# Patient Record
Sex: Female | Born: 1965
Health system: Southern US, Community
[De-identification: ages and names within clinical notes are randomized; demographics above are authoritative.]

## PROBLEM LIST (undated history)

## (undated) ENCOUNTER — Emergency Department: Discharge status: Absent without leave | Payer: Medicare Other

## (undated) DIAGNOSIS — I10 Essential (primary) hypertension: Secondary | ICD-10-CM

## (undated) DIAGNOSIS — IMO0002 Reserved for concepts with insufficient information to code with codable children: Secondary | ICD-10-CM

## (undated) DIAGNOSIS — G4730 Sleep apnea, unspecified: Secondary | ICD-10-CM

## (undated) DIAGNOSIS — F329 Major depressive disorder, single episode, unspecified: Secondary | ICD-10-CM

## (undated) DIAGNOSIS — K200 Eosinophilic esophagitis: Secondary | ICD-10-CM

## (undated) DIAGNOSIS — I491 Atrial premature depolarization: Secondary | ICD-10-CM

## (undated) DIAGNOSIS — F32A Depression, unspecified: Secondary | ICD-10-CM

## (undated) DIAGNOSIS — K219 Gastro-esophageal reflux disease without esophagitis: Secondary | ICD-10-CM

## (undated) DIAGNOSIS — E669 Obesity, unspecified: Secondary | ICD-10-CM

## (undated) DIAGNOSIS — R001 Bradycardia, unspecified: Secondary | ICD-10-CM

## (undated) DIAGNOSIS — G43909 Migraine, unspecified, not intractable, without status migrainosus: Secondary | ICD-10-CM

## (undated) DIAGNOSIS — J45909 Unspecified asthma, uncomplicated: Secondary | ICD-10-CM

## (undated) DIAGNOSIS — M797 Fibromyalgia: Secondary | ICD-10-CM

## (undated) HISTORY — PX: GALLBLADDER SURGERY: SHX652

## (undated) HISTORY — PX: COLONOSCOPY: SHX174

## (undated) HISTORY — DX: Major depressive disorder, single episode, unspecified: F32.9

## (undated) HISTORY — PX: ACHILLES TENDON REPAIR: SUR1153

## (undated) HISTORY — DX: Eosinophilic esophagitis: K20.0

## (undated) HISTORY — DX: Obesity, unspecified: E66.9

## (undated) HISTORY — DX: Depression, unspecified: F32.A

## (undated) HISTORY — DX: Migraine, unspecified, not intractable, without status migrainosus: G43.909

## (undated) HISTORY — DX: Gastro-esophageal reflux disease without esophagitis: K21.9

## (undated) HISTORY — DX: Essential (primary) hypertension: I10

---

## 2005-09-09 ENCOUNTER — Ambulatory Visit: Payer: Self-pay | Admitting: Internal Medicine

## 2005-09-18 ENCOUNTER — Ambulatory Visit: Payer: Self-pay | Admitting: Cardiology

## 2005-09-29 ENCOUNTER — Ambulatory Visit: Payer: Self-pay | Admitting: Internal Medicine

## 2006-01-20 ENCOUNTER — Ambulatory Visit: Payer: Self-pay | Admitting: Cardiology

## 2006-02-23 ENCOUNTER — Ambulatory Visit: Payer: Self-pay | Admitting: Cardiology

## 2006-03-09 ENCOUNTER — Ambulatory Visit: Payer: Self-pay | Admitting: Cardiology

## 2006-11-24 ENCOUNTER — Ambulatory Visit: Payer: Self-pay | Admitting: Cardiology

## 2007-02-08 ENCOUNTER — Encounter: Admission: RE | Admit: 2007-02-08 | Discharge: 2007-02-08 | Payer: Self-pay | Admitting: *Deleted

## 2007-09-27 ENCOUNTER — Encounter: Admission: RE | Admit: 2007-09-27 | Discharge: 2007-09-27 | Payer: Self-pay | Admitting: Unknown Physician Specialty

## 2007-10-19 ENCOUNTER — Ambulatory Visit: Payer: Self-pay | Admitting: Cardiology

## 2008-05-25 ENCOUNTER — Emergency Department (HOSPITAL_COMMUNITY): Admission: EM | Admit: 2008-05-25 | Discharge: 2008-05-25 | Payer: Self-pay | Admitting: Emergency Medicine

## 2009-05-17 ENCOUNTER — Encounter: Admission: RE | Admit: 2009-05-17 | Discharge: 2009-05-17 | Payer: Self-pay | Admitting: Neurology

## 2009-10-09 ENCOUNTER — Encounter: Admission: RE | Admit: 2009-10-09 | Discharge: 2009-11-01 | Payer: Self-pay | Admitting: Neurology

## 2010-04-02 ENCOUNTER — Emergency Department (HOSPITAL_COMMUNITY): Admission: EM | Admit: 2010-04-02 | Discharge: 2010-04-02 | Payer: Self-pay | Admitting: Emergency Medicine

## 2010-11-24 ENCOUNTER — Encounter: Payer: Self-pay | Admitting: Unknown Physician Specialty

## 2011-01-20 LAB — COMPREHENSIVE METABOLIC PANEL
Alkaline Phosphatase: 85 U/L (ref 39–117)
Calcium: 9.4 mg/dL (ref 8.4–10.5)
Chloride: 106 mEq/L (ref 96–112)
Creatinine, Ser: 0.8 mg/dL (ref 0.4–1.2)
GFR calc Af Amer: >60 mL/min (ref >60)
Glucose, Bld: 97 mg/dL (ref 70–99)
Sodium: 140 mEq/L (ref 135–145)
Total Bilirubin: 1 mg/dL (ref 0.3–1.2)

## 2011-01-20 LAB — DIFFERENTIAL
Basophils Absolute: 0.1 10*3/uL (ref 0.0–0.1)
Basophils Relative: 1 % (ref 0–1)
Eosinophils Absolute: 0 10*3/uL (ref 0.0–0.7)
Monocytes Absolute: 0.5 10*3/uL (ref 0.1–1.0)
Monocytes Relative: 7 % (ref 3–12)
Neutro Abs: 4.8 10*3/uL (ref 1.7–7.7)
Neutrophils Relative %: 65 % (ref 43–77)

## 2011-01-20 LAB — CBC
Hemoglobin: 13.3 g/dL (ref 12.0–15.0)
RBC: 4.44 MIL/uL (ref 3.87–5.11)
RDW: 13.6 % (ref 11.5–15.5)
WBC: 7.5 10*3/uL (ref 4.0–10.5)

## 2011-01-20 LAB — URINE MICROSCOPIC-ADD ON

## 2011-01-20 LAB — URINALYSIS, ROUTINE W REFLEX MICROSCOPIC
Bilirubin Urine: NEGATIVE
Ketones, ur: NEGATIVE mg/dL
Nitrite: NEGATIVE
Protein, ur: NEGATIVE mg/dL

## 2011-01-20 LAB — LIPASE, BLOOD: Lipase: 26 U/L (ref 11–59)

## 2011-01-20 LAB — URINE CULTURE: Culture: NO GROWTH

## 2011-01-23 ENCOUNTER — Other Ambulatory Visit: Payer: Self-pay | Admitting: Neurology

## 2011-01-23 DIAGNOSIS — R269 Unspecified abnormalities of gait and mobility: Secondary | ICD-10-CM

## 2011-01-23 DIAGNOSIS — R413 Other amnesia: Secondary | ICD-10-CM

## 2011-01-23 DIAGNOSIS — M79609 Pain in unspecified limb: Secondary | ICD-10-CM

## 2011-01-23 DIAGNOSIS — G2581 Restless legs syndrome: Secondary | ICD-10-CM

## 2011-02-03 ENCOUNTER — Ambulatory Visit
Admission: RE | Admit: 2011-02-03 | Discharge: 2011-02-03 | Disposition: A | Discharge status: Home / Self Care | Payer: 59 | Source: Ambulatory Visit | Attending: Neurology | Admitting: Neurology

## 2011-02-03 DIAGNOSIS — M79609 Pain in unspecified limb: Secondary | ICD-10-CM

## 2011-02-03 DIAGNOSIS — G2581 Restless legs syndrome: Secondary | ICD-10-CM

## 2011-02-03 DIAGNOSIS — R269 Unspecified abnormalities of gait and mobility: Secondary | ICD-10-CM

## 2011-02-03 DIAGNOSIS — R413 Other amnesia: Secondary | ICD-10-CM

## 2011-03-21 NOTE — Assessment & Plan Note (Signed)
[facility]                          [facility] NOTE   Alexa, Wright                    MRN:          [id number]  DATE:11/24/2006                            DOB:          12/30/1965    PRIMARY CARDIOLOGIST:  Dr. Willa Rough.   REASON FOR VISIT:  Annual followup.   Since last seen here in the clinic in April of 2007, by Roxanne Mins,  PA-C, the patient reports no interim development of tachy palpitations  or fluttering.   The patient presents with a reported history of SVT dating back to at  least 10 years ago when she was reportedly initially diagnosed with this  at [facility].  We have also, since, seen her for evaluation  of chest pain, felt to be atypical, and for which she had a normal  adequate exercise stress echocardiogram in November 2006, reviewed by  Dr. Willa Rough.   Following her last visit here in the clinic, the patient was placed on a  30-day event monitor, which revealed normal sinus rhythm/sinus  tachycardia with no evidence of any dysrhythmia.   The patient reports today that she is no longer on any medications.  Of  note, she had already been weaned off of the atenolol that Arnette Felts  had been adjusting secondary to complaints of bradycardia.  Of note, the  patient also has a history of asthma and had been briefly hospitalized  with acute bronchitis.   Most recently, the patient has been undergoing evaluation of short-term  memory loss and was felt that it would help to stop the Topamax in this  regard.  Therefore, she is currently not on any medication.   The patient also reports that she has been plagued by a slow heart  rate as well.  She reports numbers in the 50-60 range when seen by her  primary care physician.  However, she reports no associated pre-  syncope/syncope.   The patient continues to try to be compliant by refraining from any  caffeinated beverages.  She does,  however, eat chocolate on occasion.   CURRENT MEDICATIONS:  None.   PHYSICAL EXAMINATION:  Blood pressure 102/76, pulse 60 and regular,  weight 192.8.  GENERAL:  A 45 year old female sitting upright in no distress.  HEENT:  Normocephalic, atraumatic.  NECK:  Palpable bilateral carotid pulses without bruits; no JVD.  LUNGS:  Clear to auscultation in all fields.  HEART:  Regular rate and rhythm (S1, S2).  No murmurs, rubs, or gallops.  ABDOMEN:  Soft and nontender.  Intact bowel sounds.  EXTREMITIES:  Palpable pulses with trace edema.  NEURO:  Flat affect but no focal deficit.   IMPRESSION:  1. History of tachy-palpitations.      a.     Reported history of supraventricular tachycardia.      b.     Intolerant to atenolol secondary to bradycardia.      c.     Intolerant to Cardizem secondary to severe lower extremity       edema.      d.     Normal 30-day  event monitor May 2007.  2. Atypical chest pain.      a.     Normal adequate exercise stress echocardiogram in November       2006.  3. History of anxiety.  4. Asthma.  5. History of migraines.  6. Obesity.      a.     History of anorexia/bulimia.   PLAN:  No further cardiac workup is recommended at this time.  The  patient has been reassured with respect to her most recent event  monitor, which was done last year showing normal sinus rhythm.  In the  absence of any interim development of tachy palpitations, therefore, we  have agreed to have the patient return to the clinic and follow-up with  Dr. Willa Rough on an as needed basis.      Rozell Searing, PA-C  Electronically Signed      Luis Abed, MD, [facility]  Electronically Signed   GS/MedQ  DD: 11/24/2006  DT: 11/24/2006  Job #: [id number]   cc:   Donzetta Sprung

## 2011-08-01 LAB — POCT I-STAT, CHEM 8
Chloride: 105
Glucose, Bld: 100 — ABNORMAL HIGH
Hemoglobin: 14.3
Potassium: 3.7
TCO2: 26

## 2011-08-01 LAB — POCT CARDIAC MARKERS
CKMB, poc: <1 — ABNORMAL LOW
Troponin i, poc: <0.05

## 2012-05-15 ENCOUNTER — Emergency Department (HOSPITAL_COMMUNITY)
Admission: EM | Admit: 2012-05-15 | Discharge: 2012-05-16 | Disposition: A | Discharge status: Home / Self Care | Payer: 59 | Attending: Emergency Medicine | Admitting: Emergency Medicine

## 2012-05-15 ENCOUNTER — Emergency Department (HOSPITAL_COMMUNITY): Payer: 59

## 2012-05-15 ENCOUNTER — Encounter (HOSPITAL_COMMUNITY): Payer: Self-pay | Admitting: Emergency Medicine

## 2012-05-15 DIAGNOSIS — R079 Chest pain, unspecified: Secondary | ICD-10-CM

## 2012-05-15 DIAGNOSIS — M79606 Pain in leg, unspecified: Secondary | ICD-10-CM

## 2012-05-15 DIAGNOSIS — J45909 Unspecified asthma, uncomplicated: Secondary | ICD-10-CM | POA: Insufficient documentation

## 2012-05-15 DIAGNOSIS — Z79899 Other long term (current) drug therapy: Secondary | ICD-10-CM | POA: Insufficient documentation

## 2012-05-15 DIAGNOSIS — IMO0002 Reserved for concepts with insufficient information to code with codable children: Secondary | ICD-10-CM | POA: Insufficient documentation

## 2012-05-15 DIAGNOSIS — R209 Unspecified disturbances of skin sensation: Secondary | ICD-10-CM | POA: Insufficient documentation

## 2012-05-15 DIAGNOSIS — M79609 Pain in unspecified limb: Secondary | ICD-10-CM | POA: Insufficient documentation

## 2012-05-15 DIAGNOSIS — M7989 Other specified soft tissue disorders: Secondary | ICD-10-CM | POA: Insufficient documentation

## 2012-05-15 DIAGNOSIS — R001 Bradycardia, unspecified: Secondary | ICD-10-CM | POA: Insufficient documentation

## 2012-05-15 DIAGNOSIS — M797 Fibromyalgia: Secondary | ICD-10-CM | POA: Insufficient documentation

## 2012-05-15 DIAGNOSIS — R0602 Shortness of breath: Secondary | ICD-10-CM | POA: Insufficient documentation

## 2012-05-15 DIAGNOSIS — I491 Atrial premature depolarization: Secondary | ICD-10-CM | POA: Insufficient documentation

## 2012-05-15 HISTORY — DX: Reserved for concepts with insufficient information to code with codable children: IMO0002

## 2012-05-15 HISTORY — DX: Unspecified asthma, uncomplicated: J45.909

## 2012-05-15 HISTORY — DX: Fibromyalgia: M79.7

## 2012-05-15 HISTORY — DX: Atrial premature depolarization: I49.1

## 2012-05-15 HISTORY — DX: Bradycardia, unspecified: R00.1

## 2012-05-15 LAB — CBC WITH DIFFERENTIAL/PLATELET
Basophils Absolute: 0.1 10*3/uL (ref 0.0–0.1)
Basophils Relative: 1 % (ref 0–1)
Eosinophils Absolute: 0.1 10*3/uL (ref 0.0–0.7)
Eosinophils Relative: 2 % (ref 0–5)
HCT: 40.1 % (ref 36.0–46.0)
Hemoglobin: 13.7 g/dL (ref 12.0–15.0)
Lymphocytes Relative: 28 % (ref 12–46)
Lymphs Abs: 1.9 10*3/uL (ref 0.7–4.0)
MCH: 29.7 pg (ref 26.0–34.0)
MCHC: 34.2 g/dL (ref 30.0–36.0)
MCV: 86.8 fL (ref 78.0–100.0)
Monocytes Absolute: 0.5 10*3/uL (ref 0.1–1.0)
Monocytes Relative: 8 % (ref 3–12)
Neutro Abs: 4.2 10*3/uL (ref 1.7–7.7)
Neutrophils Relative %: 61 % (ref 43–77)
Platelets: 237 10*3/uL (ref 150–400)
RBC: 4.62 MIL/uL (ref 3.87–5.11)
RDW: 13 % (ref 11.5–15.5)
WBC: 6.8 10*3/uL (ref 4.0–10.5)

## 2012-05-15 LAB — COMPREHENSIVE METABOLIC PANEL
ALT: 28 U/L (ref 0–35)
AST: 26 U/L (ref 0–37)
Albumin: 3.6 g/dL (ref 3.5–5.2)
Alkaline Phosphatase: 87 U/L (ref 39–117)
BUN: 6 mg/dL (ref 6–23)
CO2: 28 mEq/L (ref 19–32)
Calcium: 9.1 mg/dL (ref 8.4–10.5)
Chloride: 102 mEq/L (ref 96–112)
Creatinine, Ser: 0.69 mg/dL (ref 0.50–1.10)
GFR calc Af Amer: >90 mL/min (ref >90)
GFR calc non Af Amer: >90 mL/min (ref >90)
Glucose, Bld: 105 mg/dL — ABNORMAL HIGH (ref 70–99)
Potassium: 3.3 mEq/L — ABNORMAL LOW (ref 3.5–5.1)
Sodium: 140 mEq/L (ref 135–145)
Total Bilirubin: 0.3 mg/dL (ref 0.3–1.2)
Total Protein: 7 g/dL (ref 6.0–8.3)

## 2012-05-15 LAB — D-DIMER, QUANTITATIVE (NOT AT ARMC): D-Dimer, Quant: 0.55 ug/mL-FEU — ABNORMAL HIGH (ref 0.00–0.48)

## 2012-05-15 LAB — POCT I-STAT TROPONIN I: Troponin i, poc: 0 ng/mL (ref 0.00–0.08)

## 2012-05-15 MED ORDER — ENOXAPARIN SODIUM 100 MG/ML ~~LOC~~ SOLN
1.0000 mg/kg | Freq: Once | SUBCUTANEOUS | Status: AC
  Administered 2012-05-15: 90 mg via SUBCUTANEOUS
  Filled 2012-05-15: qty 1

## 2012-05-15 MED ORDER — FENTANYL CITRATE 0.05 MG/ML IJ SOLN
INTRAMUSCULAR | Status: AC
  Filled 2012-05-15: qty 2

## 2012-05-15 MED ORDER — MORPHINE SULFATE 4 MG/ML IJ SOLN
6.0000 mg | Freq: Once | INTRAMUSCULAR | Status: AC
  Administered 2012-05-15: 6 mg via INTRAVENOUS
  Filled 2012-05-15: qty 2

## 2012-05-15 MED ORDER — FENTANYL CITRATE 0.05 MG/ML IJ SOLN
100.0000 ug | Freq: Once | INTRAMUSCULAR | Status: AC
  Administered 2012-05-15: 100 ug via INTRAVENOUS
  Filled 2012-05-15 (×2): qty 2

## 2012-05-15 MED ORDER — FENTANYL CITRATE 0.05 MG/ML IJ SOLN
50.0000 ug | Freq: Once | INTRAMUSCULAR | Status: AC
  Administered 2012-05-15: 50 ug via INTRAVENOUS

## 2012-05-15 NOTE — ED Notes (Addendum)
Patient had surgery on her left leg Wednesday (achillies repair); patient reports shortness of breath, tightness around her cast -- called the doctors office and was told to come to the ED for rule out of blood clot.  Sensation and color intact distally to cast.  Patient reports chest tightness.

## 2012-05-15 NOTE — ED Provider Notes (Signed)
History    46yF with L leg pain. Post-op L achilles repair. Says leg feels swollen and numb. This morning had vague upper chest pain which has resolved and also some SOB. NO fever or chills. No n/v. No dizziness or lightheadedness. Denies hx of blood clot.    CSN: [id number]  Arrival date & time 05/15/12  1478   First MD Initiated Contact with Patient 05/15/12 2027      Chief Complaint  Patient presents with  . Leg Pain    (Consider location/radiation/quality/duration/timing/severity/associated sxs/prior treatment) HPI  Past Medical History  Diagnosis Date  . Asthma   . PAC (premature atrial contraction)   . Fibromyalgia   . Bradycardia   . Degenerative disc disease     Past Surgical History  Procedure Date  . Achilles tendon repair     History reviewed. No pertinent family history.  History  Substance Use Topics  . Smoking status: Never Smoker   . Smokeless tobacco: Not on file  . Alcohol Use: No    OB History    Grav Para Term Preterm Abortions TAB SAB Ect Mult Living                  Review of Systems   Review of symptoms negative unless otherwise noted in HPI.  Allergies  Dilaudid and Hydrocodone  Home Medications   Current Outpatient Rx  Name Route Sig Dispense Refill  . ALPRAZOLAM 0.5 MG PO TABS Oral Take 0.5 mg by mouth at bedtime as needed. For sleep or anxiety    . ASPIRIN 325 MG PO TABS Oral Take 325 mg by mouth daily.    . CYCLOBENZAPRINE HCL 5 MG PO TABS Oral Take 5 mg by mouth 3 (three) times daily as needed. For back spasms    . HYDROCODONE-ACETAMINOPHEN 5-325 MG PO TABS Oral Take 1-2 tablets by mouth every 6 (six) hours as needed. For pain    . METHOCARBAMOL 500 MG PO TABS Oral Take 500 mg by mouth every 8 (eight) hours.    . OMEPRAZOLE 20 MG PO CPDR Oral Take 20 mg by mouth daily.    Marland Kitchen OVER THE COUNTER MEDICATION Oral Take 1 tablet by mouth daily. [facility] OTC pill called Itchy Skin      BP 118/83  Pulse 85  Temp 98.5 F (36.9 C)  (Oral)  Resp 12  SpO2 98%  LMP 05/04/2012  Physical Exam  Nursing note and vitals reviewed. Constitutional: She appears well-developed and well-nourished. No distress.  HENT:  Head: Normocephalic and atraumatic.  Eyes: Conjunctivae are normal. Right eye exhibits no discharge. Left eye exhibits no discharge.  Neck: Neck supple.  Cardiovascular: Normal rate, regular rhythm and normal heart sounds.  Exam reveals no gallop and no friction rub.   No murmur heard. Pulmonary/Chest: Effort normal and breath sounds normal. No respiratory distress.  Abdominal: Soft. She exhibits no distension. There is no tenderness.  Musculoskeletal: She exhibits no edema and no tenderness.       Left lower extremity in short-leg cast. Skin is warm. Sensation is intact to light touch. Cap refill is less than 2 seconds. Easily moving all toes. No edema proximal to splint noted.  Neurological: She is alert.  Skin: Skin is warm and dry.  Psychiatric: She has a normal mood and affect. Her behavior is normal. Thought content normal.    ED Course  Procedures (including critical care time)  Labs Reviewed  COMPREHENSIVE METABOLIC PANEL - Abnormal; Notable for the following:  Potassium 3.3 (*)     Glucose, Bld 105 (*)     All other components within normal limits  D-DIMER, QUANTITATIVE - Abnormal; Notable for the following:    D-Dimer, Quant 0.55 (*)     All other components within normal limits  CBC WITH DIFFERENTIAL  POCT I-STAT TROPONIN I   Dg Chest 2 View  05/15/2012  *RADIOLOGY REPORT*  Clinical Data: Shortness of breath, chest pain.  CHEST - 2 VIEW  Comparison: 05/25/2008  Findings: Mild increased AP diameter. Mild interstitial prominence may be accentuated by hypoaeration and patient body habitus.  No focal consolidation, pleural effusion, or pneumothorax.  Heart size upper normal limits.  Mild aortic tortuosity.  Multilevel degenerative changes.  No acute osseous finding.  IMPRESSION: No focal  consolidation.  Original Report Authenticated By: Waneta Martins, M.D.   EKG:  Rhythm: normal sinus Rate: 86 Axis: normal Intervals: normal ST segments: NS ST changes     1. Leg pain   2. Chest pain       MDM  46 year old female with left leg pain and swelling. Suspect that this is related to postop changes. Splint re-wrapped for comfort. She is neurovascularly intact. Patient is at increased risk for blood clot. Will dose empirically Lovenox. Arrange for ultrasound tomorrow morning. No respiratory distress and no respiratory complaints. O2 sats 98-100% on RA. No tachycardia. RR in low teens. Only minimal elevation in d-dimer which not unexpected given recent procedure.      Raeford Razor, MD 05/15/12 2325

## 2012-05-16 ENCOUNTER — Ambulatory Visit (HOSPITAL_COMMUNITY)
Admission: RE | Admit: 2012-05-16 | Discharge: 2012-05-16 | Disposition: A | Discharge status: Home / Self Care | Payer: 59 | Source: Ambulatory Visit | Attending: Emergency Medicine | Admitting: Emergency Medicine

## 2012-05-16 DIAGNOSIS — M79609 Pain in unspecified limb: Secondary | ICD-10-CM | POA: Insufficient documentation

## 2012-05-16 DIAGNOSIS — M79606 Pain in leg, unspecified: Secondary | ICD-10-CM

## 2012-05-16 DIAGNOSIS — M7989 Other specified soft tissue disorders: Secondary | ICD-10-CM

## 2013-03-04 ENCOUNTER — Ambulatory Visit (INDEPENDENT_AMBULATORY_CARE_PROVIDER_SITE_OTHER): Payer: Medicare Other | Admitting: Neurology

## 2013-03-04 ENCOUNTER — Encounter: Payer: Self-pay | Admitting: Neurology

## 2013-03-04 VITALS — BP 136/90 | HR 84 | Ht 66.0 in | Wt 192.0 lb

## 2013-03-04 DIAGNOSIS — G43009 Migraine without aura, not intractable, without status migrainosus: Secondary | ICD-10-CM

## 2013-03-04 DIAGNOSIS — R42 Dizziness and giddiness: Secondary | ICD-10-CM

## 2013-03-04 NOTE — Progress Notes (Signed)
Reason for visit: Vertigo  Alexa Wright is a 47 y.o. female  History of present illness:  Ms. Cannaday is a 47 year old right-handed white female with a history of migraine headaches since around age 5. The patient indicates that her headaches are associated with the menstrual cycle, generally occurring once a month. The patient has classic migraine, and she will get visual phenomena associated fortification spectra involving the right visual field, and it then gradually expands in the visual field over 30 minutes, and then goes away. Thirty minutes after that, the headache will ensue, usually in the frontal areas associated with a throbbing pain, and visual blurring. The patient indicates that the headache usually lasts for one day and then goes away. The patient will take Tylenol for the headache, and if she gets the medication in early it is helpful. In the past, she has been on Topamax with some benefit. The patient had an episode 3 weeks ago associated with vertigo, and she had a sensation that her eyes were crossing. The vertigo event subsided and within 2 hours, the patient had a headache that ensued and lasted for about 5 days. The patient has had resolution of the headache and the dizziness at this point. The patient indicates that with her usual headaches, she will get some numbness and tingly sensations in the hands and feet. The patient has undergone MRI evaluation the brain that shows punctate white matter lesions typical for individuals with migraine. No other significant abnormalities were seen. The patient is sent to this office for further evaluation. The patient indicates that she has photophobia and some mild phonophobia with headache. The patient indicates that stress and menstrual cycle will activate her headache.  Past Medical History  Diagnosis Date  . Asthma   . PAC (premature atrial contraction)   . Fibromyalgia   . Bradycardia   . Degenerative disc disease   .  Migraine   . Depression   . Obesity   . Gastroesophageal reflux disease     Past Surgical History  Procedure Laterality Date  . Achilles tendon repair    . Gallbladder surgery      Family History  Problem Relation Age of Onset  . Cancer Mother     Esphogeal cancer  . Heart attack Father   . Mental retardation Sister   . Migraines Sister   . Migraines Sister     Social history:  reports that she has quit smoking. She does not have any smokeless tobacco history on file. She reports that she does not drink alcohol or use illicit drugs.  Medications:  Current Outpatient Prescriptions on File Prior to Visit  Medication Sig Dispense Refill  . ALPRAZolam (XANAX) 0.5 MG tablet Take 0.5 mg by mouth at bedtime as needed. For sleep or anxiety      . cyclobenzaprine (FLEXERIL) 5 MG tablet Take 5 mg by mouth 3 (three) times daily as needed. For back spasms      . omeprazole (PRILOSEC) 20 MG capsule Take 20 mg by mouth daily.       No current facility-administered medications on file prior to visit.    Allergies:  Allergies  Allergen Reactions  . Dilaudid (Hydromorphone Hcl)   . Hydrocodone   . Morphine And Related     Decreased respirations  . Latex Rash    ROS:  Out of a complete 14 system review of symptoms, the patient complains only of the following symptoms, and all other reviewed systems are negative.  Fatigue Chest pain, palpitations, swelling in the legs Ringing in the ears Moles Blurred vision, loss of vision Diarrhea Easy bruising Feeling cold Joint pain, muscle cramps, achy muscles Memory loss, confusion, headache, weakness Difficulty swallowing, dizziness, tremor Difficulty sleeping, change in appetite, disinterest in activities  Blood pressure 136/90, pulse 84, height 5\' 6"  (1.676 m), weight 192 lb (87.091 kg).  Physical Exam  General: The patient is alert and cooperative at the time of the examination. The patient is moderately obese.  Head: Pupils  are equal, round, and reactive to light. Discs are flat bilaterally.  Neck: The neck is supple, no carotid bruits are noted.  Respiratory: The respiratory examination is clear.  Cardiovascular: The cardiovascular examination reveals a regular rate and rhythm, no obvious murmurs or rubs are noted.  Neuromuscular: The patient has good range of movement of the cervical spine. No crepitus is noted in the temporomandibular joints.  Skin: Extremities are without significant edema.  Neurologic Exam  Mental status:  Cranial nerves: Facial symmetry is present. There is good sensation of the face to pinprick and soft touch bilaterally. The strength of the facial muscles and the muscles to head turning and shoulder shrug are normal bilaterally. Speech is well enunciated, no aphasia or dysarthria is noted. Extraocular movements are full. Visual fields are full.  Motor: The motor testing reveals 5 over 5 strength of all 4 extremities. Good symmetric motor tone is noted throughout.  Sensory: Sensory testing is intact to pinprick, soft touch, vibration sensation, and position sense on all 4 extremities, with the exception that there is some decrease in pinprick sensation on the left hand and left foot. Vibration sensation is decreased on the left foot. No evidence of extinction is noted.  Coordination: Cerebellar testing reveals good finger-nose-finger and heel-to-shin bilaterally.  Gait and station: Gait is normal. Tandem gait is normal. Romberg is negative. No drift is seen.  Reflexes: Deep tendon reflexes are symmetric, but are depressed bilaterally. Toes are downgoing bilaterally.   Assessment/Plan:  1. Classic migraine  2. Fibromyalgia  The patient has a long-standing history of migraine headache associated with visual aura. The patient had a recent migraine associated with vertigo as an aura. The vertigo may return in the future associated with headache. The patient was given samples of  Migranal to take with the onset of the aura with the migraine. If this is effective, this can be used as a treatment for her headache. The headaches are occurring on average once a month, and there is no indication at this time for daily prophylactic medications. The patient will followup through this office if needed. No further neurologic workup is indicated.  Marlan Palau MD 03/04/2013 11:54 AM  [facility] [address] [place], [place] [address]  Phone [phone] Fax [phone]

## 2015-01-31 ENCOUNTER — Encounter: Payer: Self-pay | Admitting: Cardiovascular Disease

## 2015-01-31 ENCOUNTER — Encounter: Payer: Self-pay | Admitting: *Deleted

## 2015-01-31 ENCOUNTER — Ambulatory Visit (INDEPENDENT_AMBULATORY_CARE_PROVIDER_SITE_OTHER): Payer: Medicare Other | Admitting: Cardiovascular Disease

## 2015-01-31 VITALS — BP 118/80 | HR 83 | Ht 65.0 in | Wt 214.0 lb

## 2015-01-31 DIAGNOSIS — I491 Atrial premature depolarization: Secondary | ICD-10-CM | POA: Diagnosis not present

## 2015-01-31 DIAGNOSIS — I471 Supraventricular tachycardia, unspecified: Secondary | ICD-10-CM

## 2015-01-31 DIAGNOSIS — R079 Chest pain, unspecified: Secondary | ICD-10-CM

## 2015-01-31 DIAGNOSIS — I868 Varicose veins of other specified sites: Secondary | ICD-10-CM | POA: Diagnosis not present

## 2015-01-31 DIAGNOSIS — I8390 Asymptomatic varicose veins of unspecified lower extremity: Secondary | ICD-10-CM

## 2015-01-31 DIAGNOSIS — R030 Elevated blood-pressure reading, without diagnosis of hypertension: Secondary | ICD-10-CM

## 2015-01-31 DIAGNOSIS — IMO0001 Reserved for inherently not codable concepts without codable children: Secondary | ICD-10-CM

## 2015-01-31 NOTE — Progress Notes (Signed)
Patient ID: Alexa FieldSharon H Zemanek, female   DOB: 03/07/1966, 49 y.o.   MRN: [id number]       CARDIOLOGY CONSULT NOTE  Patient ID: Alexa Wright MRN: [id number] DOB/AGE: 48/11/1965 49 y.o.  Admit date: (Not on file) Primary Physician Donzetta SprungANIEL, TERRY, MD  Reason for Consultation:   HPI: The patient is a 49 year old woman with a history of SVT, migraines, fibromyalgia, varicose veins, and a history of chest pain. She reportedly had previously undergone both echocardiography and nuclear stress testing in January 2015. These results are currently unavailable to me. These were reportedly unremarkable as per an office note in January 2015 by Dr. Hanley Haysosario. She also reportedly underwent carotid Dopplers, lower extremity venous Dopplers, and ABIs. Ultimately, it was deemed her chest pain was noncardiac in etiology at that time and more likely due to peptic ulcer disease and a hiatal hernia.  She tells me she has a history of SVT and had been on calcium channel blockers and beta blockers. She was reportedly previously seen by Dr. Gala RomneyBensimhon, Dr. Elise Benneuran, and Dr. Myrtis SerKatz. None of these medical records are currently unavailable to me. Her father, Mr. Ramon Dredgedward "Desiree HaneLeroy Harris", is also a patient of mine. She said that she's been having problems with blood pressure and it was elevated yesterday, 143/92, when checked [facility]. She has a history of heel spurs and left Achilles injuries due to this.  She was previously told she had costochondritis.  She's been having dysphagia and is scheduled for an EGD with Dr. Teena DunkBenson on April 11. She's been also experiencing dizzy spells and problems with balance and is scheduled to have a brain MRI next Friday.   She has put on 40 pounds in the last year due to physical activity from her Achilles injury.  ECG performed in the office today demonstrates normal sinus rhythm with no ischemic ST segment or T-wave abnormalities, heart rate 84 bpm.  Fam:  Father has HTN. Her mother had her thyroid removed and has a history of esophageal cancer.     Allergies  Allergen Reactions  . Dilaudid [Hydromorphone Hcl]   . Hydrocodone   . Morphine And Related     Decreased respirations  . Latex Rash    Current Outpatient Prescriptions  Medication Sig Dispense Refill  . albuterol (PROVENTIL HFA;VENTOLIN HFA) 108 (90 BASE) MCG/ACT inhaler Inhale 2 puffs into the lungs every 6 (six) hours as needed for wheezing.    Marland Kitchen. ALPRAZolam (XANAX) 0.5 MG tablet Take 0.5 mg by mouth at bedtime as needed. For sleep or anxiety    . MECLIZINE HCL PO Take by mouth as needed.    . Methocarbamol (ROBAXIN PO) Take by mouth at bedtime.    Marland Kitchen. omeprazole (PRILOSEC) 20 MG capsule Take 20 mg by mouth daily.    . sertraline (ZOLOFT) 100 MG tablet Take 100 mg by mouth daily.     No current facility-administered medications for this visit.    Past Medical History  Diagnosis Date  . Asthma   . PAC (premature atrial contraction)   . Fibromyalgia   . Bradycardia   . Degenerative disc disease   . Migraine   . Depression   . Obesity   . Gastroesophageal reflux disease     Past Surgical History  Procedure Laterality Date  . Achilles tendon repair    . Gallbladder surgery      History   Social History  . Marital Status: Married    Spouse Name:  N/A  . Number of Children: N/A  . Years of Education: N/A   Occupational History  . Not on file.   Social History Main Topics  . Smoking status: Former Smoker    Quit date: 01/31/1992  . Smokeless tobacco: Never Used  . Alcohol Use: No  . Drug Use: No  . Sexual Activity: Not on file   Other Topics Concern  . Not on file   Social History Narrative       Prior to Admission medications   Medication Sig Start Date End Date Taking? Authorizing Provider  albuterol (PROVENTIL HFA;VENTOLIN HFA) 108 (90 BASE) MCG/ACT inhaler Inhale 2 puffs into the lungs every 6 (six) hours as needed for wheezing.    Historical  Provider, MD  ALPRAZolam Prudy Feeler) 0.5 MG tablet Take 0.5 mg by mouth at bedtime as needed. For sleep or anxiety    Historical Provider, MD  cyclobenzaprine (FLEXERIL) 5 MG tablet Take 5 mg by mouth 3 (three) times daily as needed. For back spasms    Historical Provider, MD  omeprazole (PRILOSEC) 20 MG capsule Take 20 mg by mouth daily.    Historical Provider, MD     Review of systems complete and found to be negative unless listed above in HPI     Physical exam Blood pressure 118/80, pulse 83, height  (1.651 m), weight 214 lb (97.07 kg), SpO2 98 %. General: NAD Neck: No JVD, no thyromegaly or thyroid nodule.  Lungs: Clear to auscultation bilaterally with normal respiratory effort. CV: Nondisplaced PMI. Regular rate and rhythm, normal S1/S2, no S3/S4, no murmur.  Trivial periankle edema with bilateral venous varicosities.  No carotid bruit.  Normal pedal pulses.  Abdomen: Soft, nontender, obese, no distention.  Skin: Intact without lesions or rashes.  Neurologic: Alert and oriented x 3.  Psych: Normal affect. Extremities: No clubbing or cyanosis.  HEENT: Normal.   ECG: Most recent ECG reviewed.  Labs:   Lab Results  Component Value Date   WBC 6.8 05/15/2012   HGB 13.7 05/15/2012   HCT 40.1 05/15/2012   MCV 86.8 05/15/2012   PLT 237 05/15/2012   No results for input(s): NA, K, CL, CO2, BUN, CREATININE, CALCIUM, PROT, BILITOT, ALKPHOS, ALT, AST, GLUCOSE in the last 168 hours.  Invalid input(s): LABALBU No results found for: CKTOTAL, CKMB, CKMBINDEX, TROPONINI No results found for: CHOL No results found for: HDL No results found for: LDLCALC No results found for: TRIG No results found for: CHOLHDL No results found for: LDLDIRECT       Studies: No results found.  ASSESSMENT AND PLAN:  1. Elevated BP: I have instructed her to purchase a blood pressure cuff and to check it at 3-4 times per week for the next 4 weeks, but to call me back in 2 weeks to inform me of  these values. I instructed her on the proper method of checking blood pressure. I will also check blood test to assess her renal function, liver function, and thyroid function and I will then determine the most appropriate antihypertensive therapy.  2. Varicose veins: I recommend conservative measures such as weight loss via physical activity in the pool, as well as compression stockings which she already has but is not wearing at present.  3. SVT: No recurrences. No indication for therapy.  4. Cardiovascular testing: I will obtain the results of prior studies.  5. Chest pain: Possibly due to peptic ulcer disease given dysphagia for solids. Soon to have EGD.   Dispo:  f/u 4-6 weeks.  Signed: Prentice Docker, M.D., F.A.C.C.  01/31/2015, 10:32 AM

## 2015-01-31 NOTE — Patient Instructions (Signed)
Your physician recommends that you schedule a follow-up appointment in: 4-6 weeks. Your physician recommends that you continue on your current medications as directed. Please refer to the Current Medication list given to you today. Your physician recommends that you have lab work to check your CMET, TSH, Free T3 & T4. Your physician has requested that you monitor and record your blood pressure readings at home every other day to average about 4 times per week over the next month. Please use the same machine at the same time of day to check your readings and record them. Please send or call the results of your blood pressure readings after 2 weeks so that your doctor can review the results.

## 2015-02-02 ENCOUNTER — Encounter: Payer: Self-pay | Admitting: *Deleted

## 2015-02-05 ENCOUNTER — Telehealth: Payer: Self-pay | Admitting: Cardiovascular Disease

## 2015-02-05 NOTE — Telephone Encounter (Signed)
Please call patient in reference to letter that was mailed to her °

## 2015-02-06 MED ORDER — LISINOPRIL 5 MG PO TABS: 5.0000 mg | ORAL_TABLET | Freq: Every day | ORAL | Status: DC

## 2015-02-06 NOTE — Telephone Encounter (Signed)
Notified via voice mail - [facility] [place] [place]

## 2015-02-06 NOTE — Telephone Encounter (Signed)
Patient questioned if her liver function was done.  Informed patient that CMET & thyroid labs were done and liver funciton was stable.   Stated she did have some concerns over BP - see readings below.    BP this morning 133/94   81  - after getting out of bed.   02/05/15 - 2:00  134/99  75  02/04/15 - 9:00 118/93  84  02/03/15 - 9:30 pm - 149/103  92 - did have headache during this reading.    02/02/15 - 133/93  81

## 2015-02-06 NOTE — Telephone Encounter (Signed)
Start lisinopril 5 mg daily

## 2015-03-06 ENCOUNTER — Encounter: Payer: Self-pay | Admitting: Neurology

## 2015-03-06 ENCOUNTER — Ambulatory Visit (INDEPENDENT_AMBULATORY_CARE_PROVIDER_SITE_OTHER): Payer: Medicare Other | Admitting: Neurology

## 2015-03-06 VITALS — BP 110/82 | HR 80 | Ht 65.0 in | Wt 216.0 lb

## 2015-03-06 DIAGNOSIS — H8111 Benign paroxysmal vertigo, right ear: Secondary | ICD-10-CM | POA: Diagnosis not present

## 2015-03-06 DIAGNOSIS — R269 Unspecified abnormalities of gait and mobility: Secondary | ICD-10-CM

## 2015-03-06 LAB — VITAMIN B12: Vitamin B-12: 449 pg/mL (ref 211–911)

## 2015-03-06 NOTE — Progress Notes (Signed)
[facility] Neurology Division Clinic Note - Initial Visit   Date: 03/06/2015   Alexa Wright MRN: [id number] DOB: 07-22-1966   Dear Dr. Darrelyn Hillock:  Thank you for your kind referral of Alexa Wright for consultation of gait imbalance. Although her history is well known to you, please allow [place] to reiterate it for the purpose of our medical record. The patient was accompanied to the clinic by husband who also provides collateral information.     History of Present Illness: Alexa Wright is a 49 y.o. right-handed Caucasian female with GERD, asthma, fibromyalgia, and depression presenting for evaluation of gait imbalance.  Starting around 2010, she started developing spells of spinning sensation, as if she can pass out.  Spells occur daily and she reports to having associated falls sometimes.  She is more unsteady with climbing up stairs.  She has fallen once this year and several times last year.  She reports having a concussion from a fall in 2012 for which she went to the hospital.  She apparently also had a dislocated knee at the same time.  There is worsening of symptoms with abrupt head movements, especially when looking upwards and to the side.  She walks independently and has not done any physical therapy.  Her father's side of the family has Charcot Marie Tooth syndrome.  She feels nauseous all the time.  She has not seen ENT.  She reptured her left Achilles tendon in 2012 when trying to job and has underwent several repairs.  She continues to have left foot pain and discomfort which compromises her walking.    No numbness or tingling.  She underwent MRI brain in April for these symptoms which showed small dural based tumor, concerning for meningioma. She saw Dr. Conchita Paris who ordered MRI brain wwo contrast which returned normal.      Out-side paper records, electronic medical record, and images have been reviewed where available and summarized as:  MRI brain  02/11/2013:  No acute finding.  Normal study with exception of a few punctate foci of signal without the left temporal and bifrontal white matter regions.  MRI brain wo contrast 02/02/2015:   1.  Small dural-based mass isointense to brain along the tentorium within the right ambien cistern anterolateral to the cerebral peduncle, caudal to the uncus, may represent meningioma.  Thin section T2 weight axial, coronal, and saggital sequences from the cavernous sinus through the brainstem may be of benefit.  2.  Small white matter FLAIR intensities wihtin the supratentorial brain, nonspecific.  Past Medical History  Diagnosis Date  . Asthma   . PAC (premature atrial contraction)   . Fibromyalgia   . Bradycardia   . Degenerative disc disease   . Migraine   . Depression   . Obesity   . Gastroesophageal reflux disease   . Hypertension     Past Surgical History  Procedure Laterality Date  . Achilles tendon repair Left   . Gallbladder surgery       Medications:  Current Outpatient Prescriptions on File Prior to Visit  Medication Sig Dispense Refill  . albuterol (PROVENTIL HFA;VENTOLIN HFA) 108 (90 BASE) MCG/ACT inhaler Inhale 2 puffs into the lungs every 6 (six) hours as needed for wheezing.    Marland Kitchen ALPRAZolam (XANAX) 0.5 MG tablet Take 0.5 mg by mouth at bedtime as needed. For sleep or anxiety    . omeprazole (PRILOSEC) 20 MG capsule Take 20 mg by mouth daily.    . sertraline (ZOLOFT) 100 MG  tablet Take 100 mg by mouth daily.     No current facility-administered medications on file prior to visit.    Allergies:  Allergies  Allergen Reactions  . Dilaudid [Hydromorphone Hcl]   . Hydrocodone   . Morphine And Related     Decreased respirations  . Latex Rash    Family History: Family History  Problem Relation Age of Onset  . Esophageal cancer Mother     Living  . Heart attack Father     Living  . Mental retardation Sister   . Migraines Sister   . Migraines Sister   . Migraines  Daughter     Social History: History   Social History  . Marital Status: Married    Spouse Name: N/A  . Number of Children: N/A  . Years of Education: N/A   Occupational History  . Not on file.   Social History Main Topics  . Smoking status: Former Smoker    Quit date: 01/31/1992  . Smokeless tobacco: Never Used  . Alcohol Use: No  . Drug Use: No  . Sexual Activity: Not on file   Other Topics Concern  . 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  She is on disability for ?bipolar disorder and fibromyalgia in January 2012.   She lives at home with her husband.   Highest level of education:  Associated degree.     Review of Systems:  CONSTITUTIONAL: No fevers, chills, night sweats, or weight loss.   EYES: No visual changes or eye pain ENT: No hearing changes.  No history of nose bleeds.   RESPIRATORY: No cough, wheezing and shortness of breath.   CARDIOVASCULAR: Negative for chest pain, and palpitations.   GI: Negative for abdominal discomfort, blood in stools or black stools.  No recent change in bowel habits.   GU:  No history of incontinence.   MUSCLOSKELETAL: No history of joint pain or swelling.  No myalgias.   SKIN: Negative for lesions, rash, and itching.   HEMATOLOGY/ONCOLOGY: Negative for prolonged bleeding, bruising easily, and swollen nodes.  No history of cancer.   ENDOCRINE: Negative for cold or heat intolerance, polydipsia or goiter.   PSYCH:  +depression or anxiety symptoms.   NEURO: As Above.   Vital Signs:  BP 110/82 mmHg  Pulse 80  Ht 5\' 5"  (1.651 m)  Wt 216 lb (97.977 kg)  BMI 35.94 kg/m2   General Medical Exam:   General:  Well appearing, comfortable.   Eyes/ENT: see cranial nerve examination.   Neck: No masses appreciated.  Full range of motion without tenderness.  No carotid bruits. Respiratory:  Clear to auscultation, good air entry bilaterally.   Cardiac:  Regular rate  and rhythm, no murmur.   Extremities:  No deformities, edema, or skin discoloration.  Skin:  No rashes or lesions.  Neurological Exam: MENTAL STATUS including orientation to time, place, person, recent and remote memory, attention span and concentration, language, and fund of knowledge is normal.  Speech is not dysarthric.  CRANIAL NERVES: II:  No visual field defects.  Unremarkable fundi.   III-IV-VI: Pupils equal round and reactive to light.  Normal conjugate, extra-ocular eye movements in all directions of gaze.  Mild right sided nystagmus.  No ptosis.  Intermittent eye lid twitching on the left (?blepharospasm) V:  Normal facial sensation.     VII:  Normal facial symmetry and movements.  No pathologic  facial reflexes.  VIII:  Normal hearing and vestibular function.   IX-X:  Normal palatal movement.   XI:  Normal shoulder shrug and head rotation.   XII:  Normal tongue strength and range of motion, no deviation or fasciculation.  MOTOR:  No atrophy, fasciculations or abnormal movements.  No pronator drift.  Tone is normal.    Right Upper Extremity:    Left Upper Extremity:    Deltoid  5/5   Deltoid  5/5   Biceps  5/5   Biceps  5/5   Triceps  5/5   Triceps  5/5   Wrist extensors  5/5   Wrist extensors  5/5   Wrist flexors  5/5   Wrist flexors  5/5   Finger extensors  5/5   Finger extensors  5/5   Finger flexors  5/5   Finger flexors  5/5   Dorsal interossei  5/5   Dorsal interossei  5/5   Abductor pollicis  5/5   Abductor pollicis  5/5   Tone (Ashworth scale)  0  Tone (Ashworth scale)  0   Right Lower Extremity:    Left Lower Extremity:    Hip flexors  5/5   Hip flexors  5/5   Hip extensors  5/5   Hip extensors  5/5   Knee flexors  5/5   Knee flexors  5/5   Knee extensors  5/5   Knee extensors  5/5   Dorsiflexors  5/5   Dorsiflexors  5/5   Plantarflexors  5/5   Plantarflexors  5/5   Toe extensors  5/5   Toe extensors  5/5   Toe flexors  5/5   Toe flexors  5/5   Tone (Ashworth  scale)  0  Tone (Ashworth scale)  0   MSRs:  Right                                                                 Left brachioradialis 2+  brachioradialis 2+  biceps 2+  biceps 2+  triceps 2+  triceps 2+  patellar 2+  patellar 2+  ankle jerk 2+  ankle jerk * s/p achilles repair 0  Hoffman no  Hoffman no  plantar response down  plantar response down   SENSORY:  Normal and symmetric perception of light touch, pinprick, vibration, and proprioception.  Romberg's sign is difficult to test as patient keep taking steps backwards, because of standing on toes on only left foot.   COORDINATION/GAIT: Normal finger-to- nose-finger and heel-to-shin.  Intact rapid alternating movements bilaterally. Gait is somewhat antalgic, favoring the right side and keeping the left foot in plantar flexion.  Stance is somewhat wide-based.  unable to assess stressed or tandem gait due to unsteadiness.  Other:  Dix hall-pike is positive on the right only  IMPRESSION: Ms. Taber is a 49 year-old female presenting for evaluation of gait imbalance and dizziness.  Her exam is notable for reproduction of symptoms with Dix Hall-pike testing to the right only, suggesting that symptoms are most likely due to benign peripheral positional vertigo.  I reviewed her images and do not find any deficits due to cerebellar dysfunction.  Her gait is difficult to assess accurately because she walks on her toes on the left due to tightening of  her heel tendons from previous surgeries.  I do not find any evidence of neuropathy on exam.  At this juncture, I will screen for vitamin B12 and D deficiency and refer her to vestibular therapy.  If no improvement, she may need ENT evaluation.  Regarding her left eye twitching, I was unable to appreciate it too much, but she may have blepharospasm, less likely myasthenia gravis.  If symptoms do not improve, consider EMG with repetitive nerve stimulation.  Return to clinic as needed  The duration  of this appointment visit was 40 minutes of face-to-face time with the patient.  Greater than 50% of this time was spent in counseling, explanation of diagnosis, planning of further management, and coordination of care.   Thank you for allowing me to participate in patient's care.  If I can answer any additional questions, I would be pleased to do so.    Sincerely,    Donika K. Allena Katz, DO

## 2015-03-06 NOTE — Progress Notes (Signed)
Note sent

## 2015-03-06 NOTE — Patient Instructions (Signed)
1.  Check blood work 2.  Start vestibular therapy

## 2015-03-07 ENCOUNTER — Telehealth: Payer: Self-pay | Admitting: Neurology

## 2015-03-07 LAB — VITAMIN D 25 HYDROXY (VIT D DEFICIENCY, FRACTURES): VIT D 25 HYDROXY: 17 ng/mL — AB (ref 30–100)

## 2015-03-07 NOTE — Telephone Encounter (Signed)
Please inform patient that her vitamin D is low and I recommend she start taking vitamin D3 2000 units daily.  Remaining labs looked okay.  Donika K. Allena KatzPatel, DO

## 2015-03-07 NOTE — Telephone Encounter (Signed)
Patient given results and instructions.   

## 2015-03-07 NOTE — Telephone Encounter (Signed)
Left message for patient to call me back. 

## 2015-03-08 ENCOUNTER — Ambulatory Visit (INDEPENDENT_AMBULATORY_CARE_PROVIDER_SITE_OTHER): Payer: Medicare Other | Admitting: Cardiovascular Disease

## 2015-03-08 ENCOUNTER — Encounter: Payer: Self-pay | Admitting: Cardiovascular Disease

## 2015-03-08 VITALS — BP 130/92 | HR 80 | Ht 65.0 in | Wt 216.0 lb

## 2015-03-08 DIAGNOSIS — Z713 Dietary counseling and surveillance: Secondary | ICD-10-CM | POA: Diagnosis not present

## 2015-03-08 DIAGNOSIS — I10 Essential (primary) hypertension: Secondary | ICD-10-CM

## 2015-03-08 DIAGNOSIS — T464X5A Adverse effect of angiotensin-converting-enzyme inhibitors, initial encounter: Secondary | ICD-10-CM

## 2015-03-08 DIAGNOSIS — I471 Supraventricular tachycardia, unspecified: Secondary | ICD-10-CM

## 2015-03-08 DIAGNOSIS — I868 Varicose veins of other specified sites: Secondary | ICD-10-CM | POA: Diagnosis not present

## 2015-03-08 DIAGNOSIS — I8390 Asymptomatic varicose veins of unspecified lower extremity: Secondary | ICD-10-CM

## 2015-03-08 DIAGNOSIS — Z719 Counseling, unspecified: Secondary | ICD-10-CM

## 2015-03-08 DIAGNOSIS — Z7182 Exercise counseling: Secondary | ICD-10-CM

## 2015-03-08 MED ORDER — LOSARTAN POTASSIUM 25 MG PO TABS: 25.0000 mg | ORAL_TABLET | Freq: Every day | ORAL | Status: DC

## 2015-03-08 NOTE — Progress Notes (Signed)
Patient ID: Alexa FieldSharon H Wright, female   DOB: 12/28/1965, 49 y.o.   MRN: [id number]      SUBJECTIVE: The patient presents for follow-up of hypertension. Renal and thyroid function were found to be normal. She had an elevated blood pressure reading at home of 149/103 with a heart rate of 92 and this was associated with a headache. I started her on lisinopril 5 mg daily. She also has a history of SVT, migraines, fibromyalgia, varicose veins, and noncardiac chest pain. She has a history of heel spurs and a left Achilles injury and has put on 40 pounds in the last year due to physical inactivity because of this.  She tells me she developed a dry cough with lisinopril and stopped taking it. She brings in her blood pressure log and there are several diastolic readings ranging from 95-101, and one systolic reading of 149. She said she likes to eat salt and eats a lot of pork and potato chips.   Review of Systems: As per "subjective", otherwise negative.  Allergies  Allergen Reactions  . Dilaudid [Hydromorphone Hcl]   . Hydrocodone   . Morphine And Related     Decreased respirations  . Latex Rash    Current Outpatient Prescriptions  Medication Sig Dispense Refill  . albuterol (PROVENTIL HFA;VENTOLIN HFA) 108 (90 BASE) MCG/ACT inhaler Inhale 2 puffs into the lungs every 6 (six) hours as needed for wheezing.    Marland Kitchen. ALPRAZolam (XANAX) 0.5 MG tablet Take 0.5 mg by mouth at bedtime as needed. For sleep or anxiety    . lisinopril (PRINIVIL,ZESTRIL) 5 MG tablet Take 5 mg by mouth daily.    Marland Kitchen. omeprazole (PRILOSEC) 20 MG capsule Take 20 mg by mouth daily.    . sertraline (ZOLOFT) 100 MG tablet Take 100 mg by mouth daily.     No current facility-administered medications for this visit.    Past Medical History  Diagnosis Date  . Asthma   . PAC (premature atrial contraction)   . Fibromyalgia   . Bradycardia   . Degenerative disc disease   . Migraine   . Depression   . Obesity   . Gastroesophageal  reflux disease   . Hypertension     Past Surgical History  Procedure Laterality Date  . Achilles tendon repair Left   . Gallbladder surgery      History   Social History  . Marital Status: Married    Spouse Name: N/A  . Number of Children: N/A  . Years of Education: N/A   Occupational History  . Not on file.   Social History Main Topics  . Smoking status: Former Smoker    Quit date: 01/31/1992  . Smokeless tobacco: Never Used  . Alcohol Use: No  . Drug Use: No  . Sexual Activity: Not on file   Other Topics Concern  . 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  She is on disability for ?bipolar disorder and fibromyalgia in January 2012.   She lives at home with her husband.   Highest level of education:  Associated degree.      Filed Vitals:   03/08/15 0853  BP: 130/92  Pulse: 80  Height: 5\' 5"  (1.651 m)  Weight: 216 lb (97.977 kg)  SpO2: 98%    PHYSICAL EXAM General: NAD Neck: No JVD, no thyromegaly or thyroid nodule.  Lungs: Clear to auscultation bilaterally with normal respiratory effort.  CV: Nondisplaced PMI. Regular rate and rhythm, normal S1/S2, no S3/S4, no murmur. Trivial periankle edema with bilateral venous varicosities. No carotid bruit. Normal pedal pulses.  Abdomen: Soft, nontender, obese, no distention.  Skin: Intact without lesions or rashes.  Neurologic: Alert and oriented x 3.  Psych: Normal affect. Extremities: No clubbing or cyanosis.  HEENT: Normal.   ECG: Most recent ECG reviewed.      ASSESSMENT AND PLAN: 1. Essential HTN: Persistently elevated DBP, with high sodium consumption. Will start losartan 25 mg daily. Gave her extensive counseling on both reduced sodium consumption and exercise counseling (aqua therapy).  2. Varicose veins: I again recommended conservative measures such as weight loss via physical activity in the pool, as well as compression  stockings which she already has but is not wearing at present.  3. SVT: No recurrences. No indication for therapy.  4. Cardiovascular testing: I will obtain the results of prior studies.  5. Chest pain: Possibly due to peptic ulcer disease given dysphagia for solids. Soon to have EGD.   Dispo: f/u 3 months.   Prentice DockerSuresh Savva Beamer, M.D., F.A.C.C.

## 2015-03-08 NOTE — Patient Instructions (Signed)
   Stop Lisinopril  Begin Losartan 25mg  daily - new sent to pharmacy today.   Continue all other medications.   Follow up in 3 months.

## 2015-06-20 ENCOUNTER — Ambulatory Visit (INDEPENDENT_AMBULATORY_CARE_PROVIDER_SITE_OTHER): Payer: Medicare Other | Admitting: Cardiovascular Disease

## 2015-06-20 ENCOUNTER — Encounter: Payer: Self-pay | Admitting: Cardiovascular Disease

## 2015-06-20 VITALS — BP 118/84 | HR 88 | Ht 65.0 in | Wt 218.0 lb

## 2015-06-20 DIAGNOSIS — I471 Supraventricular tachycardia: Secondary | ICD-10-CM | POA: Diagnosis not present

## 2015-06-20 DIAGNOSIS — I8390 Asymptomatic varicose veins of unspecified lower extremity: Secondary | ICD-10-CM

## 2015-06-20 DIAGNOSIS — I868 Varicose veins of other specified sites: Secondary | ICD-10-CM

## 2015-06-20 DIAGNOSIS — R079 Chest pain, unspecified: Secondary | ICD-10-CM

## 2015-06-20 DIAGNOSIS — Z713 Dietary counseling and surveillance: Secondary | ICD-10-CM | POA: Diagnosis not present

## 2015-06-20 DIAGNOSIS — I10 Essential (primary) hypertension: Secondary | ICD-10-CM | POA: Diagnosis not present

## 2015-06-20 DIAGNOSIS — K229 Disease of esophagus, unspecified: Secondary | ICD-10-CM

## 2015-06-20 NOTE — Patient Instructions (Signed)
Continue all current medications. Your physician wants you to follow up in:  1 year.  You will receive a reminder letter in the mail one-two months in advance.  If you don't receive a letter, please call our office to schedule the follow up appointment   

## 2015-06-20 NOTE — Progress Notes (Signed)
Patient ID: Alexa Wright, female   DOB: [birth year], 49 y.o.   MRN: [id number]      SUBJECTIVE: The patient presents for follow-up of hypertension. She also has a history of SVT, migraines, fibromyalgia, varicose veins, and noncardiac chest pain.  She underwent esophageal dilatation in June and no longer has chest pain. Labs in March showed normal thyroid and renal function and liver transaminases. Strong family history of cirrhosis in females. She has a history of heel spurs and a left achilles injury and put on weight due to this. She enjoys eating sodium rich foods but has cut back significantly.  Checks BP at home and has been normal for most part.  Had blood work performed this morning by PCP. Having uterus biopsy today due to enlargement. Denies vaginal bleeding.   Review of Systems: As per "subjective", otherwise negative.  Allergies  Allergen Reactions  . Dilaudid [Hydromorphone Hcl]   . Hydrocodone   . Lisinopril Cough  . Morphine And Related     Decreased respirations  . Latex Rash    Current Outpatient Prescriptions  Medication Sig Dispense Refill  . albuterol (PROVENTIL HFA;VENTOLIN HFA) 108 (90 BASE) MCG/ACT inhaler Inhale 2 puffs into the lungs every 6 (six) hours as needed for wheezing.    Marland Kitchen ALPRAZolam (XANAX) 0.5 MG tablet Take 0.5 mg by mouth at bedtime as needed. For sleep or anxiety    . losartan (COZAAR) 25 MG tablet Take 1 tablet (25 mg total) by mouth daily. 30 tablet 6  . omeprazole (PRILOSEC) 20 MG capsule Take 20 mg by mouth daily.     No current facility-administered medications for this visit.    Past Medical History  Diagnosis Date  . Asthma   . PAC (premature atrial contraction)   . Fibromyalgia   . Bradycardia   . Degenerative disc disease   . Migraine   . Depression   . Obesity   . Gastroesophageal reflux disease   . Hypertension     Past Surgical History  Procedure Laterality Date  . Achilles tendon repair Left   . Gallbladder  surgery      Social History   Social History  . Marital Status: Married    Spouse Name: N/A  . Number of Children: N/A  . Years of Education: N/A   Occupational History  . Not on file.   Social History Main Topics  . Smoking status: Former Smoker    Quit date: 01/31/1992  . Smokeless tobacco: Never Used  . Alcohol Use: No  . Drug Use: No  . Sexual Activity: Not on file   Other Topics Concern  . 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  She is on disability for ?bipolar disorder and fibromyalgia in January 2012.   She lives at home with her husband.   Highest level of education:  Associated degree.      Filed Vitals:   06/20/15 0903  BP: 118/84  Pulse: 88  Height: 5\' 5"  (1.651 m)  Weight: 218 lb (98.884 kg)  SpO2: 95%    PHYSICAL EXAM General: NAD Neck: No JVD, no thyromegaly or thyroid nodule.  Lungs: Clear to auscultation bilaterally with normal respiratory effort. CV: Nondisplaced PMI. Regular rate and rhythm, normal S1/S2, no S3/S4, no murmur. No periankle edema with bilateral venous varicosities. No carotid bruit. Normal pedal pulses.  Abdomen: Soft, nontender, obese, no distention.  Skin: Intact  without lesions or rashes.  Neurologic: Alert and oriented x 3.  Psych: Normal affect. Extremities: No clubbing or cyanosis.  HEENT: Normal.   ECG: Most recent ECG reviewed.      ASSESSMENT AND PLAN: 1. Essential HTN: Well controlled on losartan 25 mg. No changes. Previously gave her extensive counseling on both reduced sodium consumption and exercise counseling (aqua therapy). Trying to adhere to low sodium diet.  2. Varicose veins: I previously recommended conservative measures such as weight loss via physical activity in the pool, as well as compression stockings which she already has but is not wearing at present.  3. SVT: No recurrences. No indication for therapy.  4. Chest  pain: Resolved after esophageal dilatation.  Dispo: f/u 1 year.   Prentice Docker, M.D., F.A.C.C.

## 2016-01-16 DIAGNOSIS — M6702 Short Achilles tendon (acquired), left ankle: Secondary | ICD-10-CM | POA: Diagnosis not present

## 2016-01-24 DIAGNOSIS — J019 Acute sinusitis, unspecified: Secondary | ICD-10-CM | POA: Diagnosis not present

## 2016-01-24 DIAGNOSIS — J209 Acute bronchitis, unspecified: Secondary | ICD-10-CM | POA: Diagnosis not present

## 2016-01-24 DIAGNOSIS — J4520 Mild intermittent asthma, uncomplicated: Secondary | ICD-10-CM | POA: Diagnosis not present

## 2016-02-26 DIAGNOSIS — K219 Gastro-esophageal reflux disease without esophagitis: Secondary | ICD-10-CM | POA: Diagnosis not present

## 2016-02-26 DIAGNOSIS — G43909 Migraine, unspecified, not intractable, without status migrainosus: Secondary | ICD-10-CM | POA: Diagnosis not present

## 2016-02-26 DIAGNOSIS — J4520 Mild intermittent asthma, uncomplicated: Secondary | ICD-10-CM | POA: Diagnosis not present

## 2016-02-26 DIAGNOSIS — R69 Illness, unspecified: Secondary | ICD-10-CM | POA: Diagnosis not present

## 2016-02-26 DIAGNOSIS — E6609 Other obesity due to excess calories: Secondary | ICD-10-CM | POA: Diagnosis not present

## 2016-02-26 DIAGNOSIS — J301 Allergic rhinitis due to pollen: Secondary | ICD-10-CM | POA: Diagnosis not present

## 2016-04-10 DIAGNOSIS — Z1389 Encounter for screening for other disorder: Secondary | ICD-10-CM | POA: Diagnosis not present

## 2016-04-10 DIAGNOSIS — K219 Gastro-esophageal reflux disease without esophagitis: Secondary | ICD-10-CM | POA: Diagnosis not present

## 2016-04-10 DIAGNOSIS — R69 Illness, unspecified: Secondary | ICD-10-CM | POA: Diagnosis not present

## 2016-04-10 DIAGNOSIS — G43909 Migraine, unspecified, not intractable, without status migrainosus: Secondary | ICD-10-CM | POA: Diagnosis not present

## 2016-04-10 DIAGNOSIS — J301 Allergic rhinitis due to pollen: Secondary | ICD-10-CM | POA: Diagnosis not present

## 2016-04-10 DIAGNOSIS — J4520 Mild intermittent asthma, uncomplicated: Secondary | ICD-10-CM | POA: Diagnosis not present

## 2016-04-10 DIAGNOSIS — E6609 Other obesity due to excess calories: Secondary | ICD-10-CM | POA: Diagnosis not present

## 2016-05-07 DIAGNOSIS — M7662 Achilles tendinitis, left leg: Secondary | ICD-10-CM | POA: Diagnosis not present

## 2016-05-07 DIAGNOSIS — M25472 Effusion, left ankle: Secondary | ICD-10-CM | POA: Diagnosis not present

## 2016-05-07 DIAGNOSIS — M6702 Short Achilles tendon (acquired), left ankle: Secondary | ICD-10-CM | POA: Diagnosis not present

## 2016-05-07 DIAGNOSIS — M79605 Pain in left leg: Secondary | ICD-10-CM | POA: Diagnosis not present

## 2016-06-07 ENCOUNTER — Other Ambulatory Visit: Payer: Self-pay | Admitting: Cardiovascular Disease

## 2016-07-29 DIAGNOSIS — Z23 Encounter for immunization: Secondary | ICD-10-CM | POA: Diagnosis not present

## 2016-08-05 ENCOUNTER — Encounter: Payer: Self-pay | Admitting: *Deleted

## 2016-08-05 ENCOUNTER — Ambulatory Visit (INDEPENDENT_AMBULATORY_CARE_PROVIDER_SITE_OTHER): Payer: Medicare HMO | Admitting: Cardiovascular Disease

## 2016-08-05 VITALS — BP 128/90 | HR 83 | Ht 68.0 in | Wt 233.0 lb

## 2016-08-05 DIAGNOSIS — I491 Atrial premature depolarization: Secondary | ICD-10-CM | POA: Diagnosis not present

## 2016-08-05 DIAGNOSIS — I10 Essential (primary) hypertension: Secondary | ICD-10-CM | POA: Diagnosis not present

## 2016-08-05 DIAGNOSIS — R0789 Other chest pain: Secondary | ICD-10-CM | POA: Diagnosis not present

## 2016-08-05 DIAGNOSIS — I8393 Asymptomatic varicose veins of bilateral lower extremities: Secondary | ICD-10-CM

## 2016-08-05 DIAGNOSIS — I471 Supraventricular tachycardia: Secondary | ICD-10-CM | POA: Diagnosis not present

## 2016-08-05 NOTE — Progress Notes (Signed)
SUBJECTIVE: The patient presents for follow-up of hypertension. She also has a history of SVT, migraines, fibromyalgia, varicose veins, and noncardiac chest pain.  She has been having more palpitations lately and said she has put on 50-60 pounds in the past year. She has left Achilles tendon pathology and might [facility]. She has had intermittent chest pains lasting for hours not associated with activity. Her chest wall is also tender to palpation.  ECG performed in the office today which I personally reviewed demonstrates normal sinus rhythm with no ischemic ST segment or T-wave abnormalities, nor any arrhythmias.    Review of Systems: As per "subjective", otherwise negative.  Allergies  Allergen Reactions  . Dilaudid [Hydromorphone Hcl]   . Hydrocodone   . Lisinopril Cough  . Morphine And Related     Decreased respirations  . Latex Rash    Current Outpatient Prescriptions  Medication Sig Dispense Refill  . albuterol (PROVENTIL HFA;VENTOLIN HFA) 108 (90 BASE) MCG/ACT inhaler Inhale 2 puffs into the lungs every 6 (six) hours as needed for wheezing.    Marland Kitchen. ALPRAZolam (XANAX) 0.5 MG tablet Take 0.5 mg by mouth at bedtime as needed. For sleep or anxiety    . losartan (COZAAR) 25 MG tablet TAKE 1 TABLET BY MOUTH DAILY 30 tablet 0  . pantoprazole (PROTONIX) 40 MG tablet Take 40 mg by mouth daily.     No current facility-administered medications for this visit.     Past Medical History:  Diagnosis Date  . Asthma   . Bradycardia   . Degenerative disc disease   . Depression   . Fibromyalgia   . Gastroesophageal reflux disease   . Hypertension   . Migraine   . Obesity   . PAC (premature atrial contraction)     Past Surgical History:  Procedure Laterality Date  . ACHILLES TENDON REPAIR Left   . GALLBLADDER SURGERY      Social History   Social History  . Marital status: Married    Spouse name: N/A  . Number of children: N/A  .  Years of education: N/A   Occupational History  . Not on file.   Social History Main Topics  . Smoking status: Former Smoker    Quit date: 01/31/1992  . Smokeless tobacco: Never Used  . Alcohol use No  . Drug use: No  . Sexual activity: Not on file   Other Topics Concern  . 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  She is on disability for ?bipolar disorder and fibromyalgia in January 2012.   She lives at home with her husband.   Highest level of education:  Associated degree.      Vitals:   08/05/16 1518  BP: 128/90  Pulse: 83  SpO2: 98%  Weight: 233 lb (105.7 kg)  Height: 5\' 8"  (1.727 m)    PHYSICAL EXAM General: NAD HEENT: Normal. Neck: No JVD, no thyromegaly. Lungs: Clear to auscultation bilaterally with normal respiratory effort. CV: Nondisplaced PMI. +tenderness to palpation of sternum. Regular rate and rhythm, normal S1/S2, no S3/S4, no murmur. Trace b/l periankle edema. Abdomen: Soft, nontender, obese.  Neurologic: Alert and oriented.  Psych: Normal affect. Skin: Normal. Musculoskeletal: No gross deformities.    ECG: Most recent ECG reviewed.      ASSESSMENT AND PLAN: 1. Essential HTN: Mildly elevated DBP. Not taking losartan. I have asked  the patient to check blood pressure readings 2 times per week, at different times throughout the day, in order to get a better approximation of mean BP values. These results will be provided to me at the end of that period so that I can determine if losartan needs to be reinitiated.  2. Varicose veins: I previously recommended conservative measures such as weight loss via physical activity in the pool, as well as compression stockings.  3. SVT: Infrequent palpitations. No indication for therapy.  4. Chest pain: Sternum is tender. Musculoskeletal in etiology.   Dispo: f/u 1 year.   Prentice Docker, M.D., F.A.C.C.

## 2016-08-05 NOTE — Patient Instructions (Signed)
Medication Instructions:  Continue all current medications.  Labwork: none  Testing/Procedures: none  Follow-Up: Your physician wants you to follow up in:  1 year.  You will receive a reminder letter in the mail one-two months in advance.  If you don't receive a letter, please call our office to schedule the follow up appointment   Any Other Special Instructions Will Be Listed Below (If Applicable). Your physician has requested that you regularly monitor and record your blood pressure readings at home twice a week x 4-6 weeks.  Please use the same machine at varied times of the day.   Return log to office for MD review.    If you need a refill on your cardiac medications before your next appointment, please call your pharmacy.

## 2016-08-12 DIAGNOSIS — Z1231 Encounter for screening mammogram for malignant neoplasm of breast: Secondary | ICD-10-CM | POA: Diagnosis not present

## 2016-09-16 ENCOUNTER — Ambulatory Visit: Payer: Medicare Other | Admitting: Cardiovascular Disease

## 2016-09-19 DIAGNOSIS — J0100 Acute maxillary sinusitis, unspecified: Secondary | ICD-10-CM | POA: Diagnosis not present

## 2016-09-19 DIAGNOSIS — Z6838 Body mass index (BMI) 38.0-38.9, adult: Secondary | ICD-10-CM | POA: Diagnosis not present

## 2016-10-15 DIAGNOSIS — I10 Essential (primary) hypertension: Secondary | ICD-10-CM | POA: Diagnosis not present

## 2016-10-15 DIAGNOSIS — Z79899 Other long term (current) drug therapy: Secondary | ICD-10-CM | POA: Diagnosis not present

## 2016-10-15 DIAGNOSIS — K219 Gastro-esophageal reflux disease without esophagitis: Secondary | ICD-10-CM | POA: Diagnosis not present

## 2016-10-15 DIAGNOSIS — R51 Headache: Secondary | ICD-10-CM | POA: Diagnosis not present

## 2016-10-15 DIAGNOSIS — J45909 Unspecified asthma, uncomplicated: Secondary | ICD-10-CM | POA: Diagnosis not present

## 2016-10-15 DIAGNOSIS — Z87891 Personal history of nicotine dependence: Secondary | ICD-10-CM | POA: Diagnosis not present

## 2016-10-16 DIAGNOSIS — I10 Essential (primary) hypertension: Secondary | ICD-10-CM | POA: Diagnosis not present

## 2016-10-16 DIAGNOSIS — R002 Palpitations: Secondary | ICD-10-CM | POA: Diagnosis not present

## 2016-10-16 DIAGNOSIS — Z6838 Body mass index (BMI) 38.0-38.9, adult: Secondary | ICD-10-CM | POA: Diagnosis not present

## 2016-10-16 DIAGNOSIS — E6609 Other obesity due to excess calories: Secondary | ICD-10-CM | POA: Diagnosis not present

## 2016-11-05 DIAGNOSIS — G43909 Migraine, unspecified, not intractable, without status migrainosus: Secondary | ICD-10-CM | POA: Diagnosis not present

## 2016-11-05 DIAGNOSIS — E6609 Other obesity due to excess calories: Secondary | ICD-10-CM | POA: Diagnosis not present

## 2016-11-05 DIAGNOSIS — K219 Gastro-esophageal reflux disease without esophagitis: Secondary | ICD-10-CM | POA: Diagnosis not present

## 2016-11-05 DIAGNOSIS — R69 Illness, unspecified: Secondary | ICD-10-CM | POA: Diagnosis not present

## 2016-11-05 DIAGNOSIS — Z6838 Body mass index (BMI) 38.0-38.9, adult: Secondary | ICD-10-CM | POA: Diagnosis not present

## 2016-11-05 DIAGNOSIS — J4520 Mild intermittent asthma, uncomplicated: Secondary | ICD-10-CM | POA: Diagnosis not present

## 2016-11-05 DIAGNOSIS — J301 Allergic rhinitis due to pollen: Secondary | ICD-10-CM | POA: Diagnosis not present

## 2016-12-06 DIAGNOSIS — J069 Acute upper respiratory infection, unspecified: Secondary | ICD-10-CM | POA: Diagnosis not present

## 2016-12-06 DIAGNOSIS — Z6838 Body mass index (BMI) 38.0-38.9, adult: Secondary | ICD-10-CM | POA: Diagnosis not present

## 2016-12-15 DIAGNOSIS — Z6838 Body mass index (BMI) 38.0-38.9, adult: Secondary | ICD-10-CM | POA: Diagnosis not present

## 2016-12-15 DIAGNOSIS — J209 Acute bronchitis, unspecified: Secondary | ICD-10-CM | POA: Diagnosis not present

## 2016-12-15 DIAGNOSIS — J019 Acute sinusitis, unspecified: Secondary | ICD-10-CM | POA: Diagnosis not present

## 2016-12-16 DIAGNOSIS — R69 Illness, unspecified: Secondary | ICD-10-CM | POA: Diagnosis not present

## 2016-12-17 DIAGNOSIS — R509 Fever, unspecified: Secondary | ICD-10-CM | POA: Diagnosis not present

## 2016-12-17 DIAGNOSIS — R062 Wheezing: Secondary | ICD-10-CM | POA: Diagnosis not present

## 2016-12-17 DIAGNOSIS — J209 Acute bronchitis, unspecified: Secondary | ICD-10-CM | POA: Diagnosis not present

## 2016-12-17 DIAGNOSIS — Z6837 Body mass index (BMI) 37.0-37.9, adult: Secondary | ICD-10-CM | POA: Diagnosis not present

## 2016-12-26 DIAGNOSIS — J45909 Unspecified asthma, uncomplicated: Secondary | ICD-10-CM | POA: Diagnosis not present

## 2017-01-13 DIAGNOSIS — Z6837 Body mass index (BMI) 37.0-37.9, adult: Secondary | ICD-10-CM | POA: Diagnosis not present

## 2017-01-13 DIAGNOSIS — R509 Fever, unspecified: Secondary | ICD-10-CM | POA: Diagnosis not present

## 2017-01-13 DIAGNOSIS — J0101 Acute recurrent maxillary sinusitis: Secondary | ICD-10-CM | POA: Diagnosis not present

## 2017-01-13 DIAGNOSIS — J209 Acute bronchitis, unspecified: Secondary | ICD-10-CM | POA: Diagnosis not present

## 2017-01-23 DIAGNOSIS — J45909 Unspecified asthma, uncomplicated: Secondary | ICD-10-CM | POA: Diagnosis not present

## 2017-02-17 DIAGNOSIS — R69 Illness, unspecified: Secondary | ICD-10-CM | POA: Diagnosis not present

## 2017-02-17 DIAGNOSIS — K219 Gastro-esophageal reflux disease without esophagitis: Secondary | ICD-10-CM | POA: Diagnosis not present

## 2017-02-17 DIAGNOSIS — G43909 Migraine, unspecified, not intractable, without status migrainosus: Secondary | ICD-10-CM | POA: Diagnosis not present

## 2017-02-17 DIAGNOSIS — J301 Allergic rhinitis due to pollen: Secondary | ICD-10-CM | POA: Diagnosis not present

## 2017-02-17 DIAGNOSIS — E6609 Other obesity due to excess calories: Secondary | ICD-10-CM | POA: Diagnosis not present

## 2017-02-17 DIAGNOSIS — Z6837 Body mass index (BMI) 37.0-37.9, adult: Secondary | ICD-10-CM | POA: Diagnosis not present

## 2017-02-17 DIAGNOSIS — J4520 Mild intermittent asthma, uncomplicated: Secondary | ICD-10-CM | POA: Diagnosis not present

## 2017-02-23 DIAGNOSIS — J45909 Unspecified asthma, uncomplicated: Secondary | ICD-10-CM | POA: Diagnosis not present

## 2017-03-25 DIAGNOSIS — J45909 Unspecified asthma, uncomplicated: Secondary | ICD-10-CM | POA: Diagnosis not present

## 2017-04-25 DIAGNOSIS — J45909 Unspecified asthma, uncomplicated: Secondary | ICD-10-CM | POA: Diagnosis not present

## 2017-05-25 DIAGNOSIS — J45909 Unspecified asthma, uncomplicated: Secondary | ICD-10-CM | POA: Diagnosis not present

## 2017-06-02 DIAGNOSIS — J45909 Unspecified asthma, uncomplicated: Secondary | ICD-10-CM | POA: Diagnosis not present

## 2017-06-05 DIAGNOSIS — K210 Gastro-esophageal reflux disease with esophagitis: Secondary | ICD-10-CM | POA: Diagnosis not present

## 2017-06-05 DIAGNOSIS — Z9189 Other specified personal risk factors, not elsewhere classified: Secondary | ICD-10-CM | POA: Diagnosis not present

## 2017-06-05 DIAGNOSIS — R5383 Other fatigue: Secondary | ICD-10-CM | POA: Diagnosis not present

## 2017-06-05 DIAGNOSIS — I10 Essential (primary) hypertension: Secondary | ICD-10-CM | POA: Diagnosis not present

## 2017-06-12 DIAGNOSIS — Z6837 Body mass index (BMI) 37.0-37.9, adult: Secondary | ICD-10-CM | POA: Diagnosis not present

## 2017-06-12 DIAGNOSIS — K219 Gastro-esophageal reflux disease without esophagitis: Secondary | ICD-10-CM | POA: Diagnosis not present

## 2017-06-12 DIAGNOSIS — G43909 Migraine, unspecified, not intractable, without status migrainosus: Secondary | ICD-10-CM | POA: Diagnosis not present

## 2017-06-12 DIAGNOSIS — J4520 Mild intermittent asthma, uncomplicated: Secondary | ICD-10-CM | POA: Diagnosis not present

## 2017-06-12 DIAGNOSIS — Z1389 Encounter for screening for other disorder: Secondary | ICD-10-CM | POA: Diagnosis not present

## 2017-06-12 DIAGNOSIS — F4001 Agoraphobia with panic disorder: Secondary | ICD-10-CM | POA: Diagnosis not present

## 2017-06-12 DIAGNOSIS — R69 Illness, unspecified: Secondary | ICD-10-CM | POA: Diagnosis not present

## 2017-06-25 DIAGNOSIS — J45909 Unspecified asthma, uncomplicated: Secondary | ICD-10-CM | POA: Diagnosis not present

## 2017-07-26 DIAGNOSIS — J45909 Unspecified asthma, uncomplicated: Secondary | ICD-10-CM | POA: Diagnosis not present

## 2017-08-11 DIAGNOSIS — R300 Dysuria: Secondary | ICD-10-CM | POA: Diagnosis not present

## 2017-08-11 DIAGNOSIS — Z23 Encounter for immunization: Secondary | ICD-10-CM | POA: Diagnosis not present

## 2017-08-11 DIAGNOSIS — N3001 Acute cystitis with hematuria: Secondary | ICD-10-CM | POA: Diagnosis not present

## 2017-08-11 DIAGNOSIS — Z6838 Body mass index (BMI) 38.0-38.9, adult: Secondary | ICD-10-CM | POA: Diagnosis not present

## 2017-08-25 DIAGNOSIS — J45909 Unspecified asthma, uncomplicated: Secondary | ICD-10-CM | POA: Diagnosis not present

## 2017-09-02 DIAGNOSIS — J45909 Unspecified asthma, uncomplicated: Secondary | ICD-10-CM | POA: Diagnosis not present

## 2017-09-25 DIAGNOSIS — J45909 Unspecified asthma, uncomplicated: Secondary | ICD-10-CM | POA: Diagnosis not present

## 2017-10-02 DIAGNOSIS — G43909 Migraine, unspecified, not intractable, without status migrainosus: Secondary | ICD-10-CM | POA: Diagnosis not present

## 2017-10-02 DIAGNOSIS — R69 Illness, unspecified: Secondary | ICD-10-CM | POA: Diagnosis not present

## 2017-10-02 DIAGNOSIS — Z6837 Body mass index (BMI) 37.0-37.9, adult: Secondary | ICD-10-CM | POA: Diagnosis not present

## 2017-10-02 DIAGNOSIS — Z23 Encounter for immunization: Secondary | ICD-10-CM | POA: Diagnosis not present

## 2017-10-02 DIAGNOSIS — E6609 Other obesity due to excess calories: Secondary | ICD-10-CM | POA: Diagnosis not present

## 2017-10-02 DIAGNOSIS — J4520 Mild intermittent asthma, uncomplicated: Secondary | ICD-10-CM | POA: Diagnosis not present

## 2017-10-02 DIAGNOSIS — K219 Gastro-esophageal reflux disease without esophagitis: Secondary | ICD-10-CM | POA: Diagnosis not present

## 2017-10-15 ENCOUNTER — Other Ambulatory Visit: Payer: Self-pay

## 2017-10-15 ENCOUNTER — Encounter: Payer: Self-pay | Admitting: Cardiovascular Disease

## 2017-10-15 ENCOUNTER — Telehealth: Payer: Self-pay | Admitting: Cardiovascular Disease

## 2017-10-15 ENCOUNTER — Encounter: Payer: Self-pay | Admitting: *Deleted

## 2017-10-15 ENCOUNTER — Ambulatory Visit: Payer: Medicare HMO | Admitting: Cardiovascular Disease

## 2017-10-15 VITALS — BP 150/88 | HR 77 | Ht 65.0 in | Wt 233.0 lb

## 2017-10-15 DIAGNOSIS — I8393 Asymptomatic varicose veins of bilateral lower extremities: Secondary | ICD-10-CM

## 2017-10-15 DIAGNOSIS — R079 Chest pain, unspecified: Secondary | ICD-10-CM | POA: Diagnosis not present

## 2017-10-15 DIAGNOSIS — I10 Essential (primary) hypertension: Secondary | ICD-10-CM

## 2017-10-15 DIAGNOSIS — I471 Supraventricular tachycardia: Secondary | ICD-10-CM

## 2017-10-15 MED ORDER — LOSARTAN POTASSIUM 25 MG PO TABS: 25.0000 mg | ORAL_TABLET | Freq: Every day | ORAL | 3 refills | Status: DC

## 2017-10-15 NOTE — Telephone Encounter (Signed)
Pre-cert Verification for the following procedure   Exercise Myoview scheduled for 10-21-17  [facility]

## 2017-10-15 NOTE — Progress Notes (Signed)
SUBJECTIVE: The patient presents for follow-up of hypertension.  She also has a history of SVT, migraines, fibromyalgia, varicose veins, and noncardiac chest pain.  She tells me she is noncompliant with medications.  She is not on any antihypertensive therapy but had been taking losartan in the past.  Blood pressure is 150/88 today.  She does not check her blood pressure at home.  She has been having episodic retrosternal chest pain radiating to the back.  It can last several minutes and sometimes up to an hour.  It is associated with shortness of breath.  It is nonexertional.  It can radiate to both arms and underneath both breasts.  ECG performed in the office today which I ordered and personally interpreted demonstrates normal sinus rhythm with no ischemic ST segment or T-wave abnormalities, nor any arrhythmias.    Review of Systems: As per "subjective", otherwise negative.  Allergies  Allergen Reactions  . Dilaudid [Hydromorphone Hcl]   . Hydrocodone   . Lisinopril Cough  . Morphine And Related     Decreased respirations  . Latex Rash    Current Outpatient Medications  Medication Sig Dispense Refill  . albuterol (PROVENTIL HFA;VENTOLIN HFA) 108 (90 BASE) MCG/ACT inhaler Inhale 2 puffs into the lungs every 6 (six) hours as needed for wheezing.    Marland Kitchen. ALPRAZolam (XANAX) 0.5 MG tablet Take 0.5 mg by mouth at bedtime as needed. For sleep or anxiety    . ranitidine (ZANTAC) 150 MG tablet TAKE 1 TABLET BY MOUTH TWICE A DAY FOR ACID REFLUX  1   No current facility-administered medications for this visit.     Past Medical History:  Diagnosis Date  . Asthma   . Bradycardia   . Degenerative disc disease   . Depression   . Fibromyalgia   . Gastroesophageal reflux disease   . Hypertension   . Migraine   . Obesity   . PAC (premature atrial contraction)     Past Surgical History:  Procedure Laterality Date  . ACHILLES TENDON REPAIR Left   . GALLBLADDER SURGERY       Social History   Socioeconomic History  . Marital status: Married    Spouse name: Not on file  . Number of children: Not on file  . Years of education: Not on file  . Highest education level: Not on file  Social Needs  . Financial resource strain: Not on file  . Food insecurity - worry: Not on file  . Food insecurity - inability: Not on file  . Transportation needs - medical: Not on file  . Transportation needs - non-medical: Not on file  Occupational History  . Not on file  Tobacco Use  . Smoking status: Former Smoker    Last attempt to quit: 01/31/1992    Years since quitting: 25.7  . Smokeless tobacco: Never Used  Substance and Sexual Activity  . Alcohol use: No    Alcohol/week: 0.0 oz  . Drug use: No  . Sexual activity: Not on file  Other Topics Concern  . 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  She is on disability for ?bipolar disorder and fibromyalgia in January 2012.   She lives at home with her husband.   Highest level of education:  Associated degree.      Vitals:   10/15/17 1126  BP: (!) 150/88  Pulse: 77  SpO2: 99%  Weight:  233 lb (105.7 kg)  Height: 5\' 5"  (1.651 m)    Wt Readings from Last 3 Encounters:  10/15/17 233 lb (105.7 kg)  08/05/16 233 lb (105.7 kg)  06/20/15 218 lb (98.9 kg)     PHYSICAL EXAM General: NAD HEENT: Normal. Neck: No JVD, no thyromegaly. Lungs: Clear to auscultation bilaterally with normal respiratory effort. CV: Regular rate and rhythm, normal S1/S2, no S3/S4, no murmur. No pretibial or periankle edema.  No carotid bruit.   Abdomen: Soft, nontender, no distention.  Neurologic: Alert and oriented.  Psych: Normal affect. Skin: Normal. Musculoskeletal: No gross deformities.    ECG: Most recent ECG reviewed.   Labs: Lab Results  Component Value Date/Time   K 3.3 (L) 05/15/2012 07:57 PM   BUN 6 05/15/2012 07:57 PM   CREATININE 0.69 05/15/2012  07:57 PM   ALT 28 05/15/2012 07:57 PM   HGB 13.7 05/15/2012 07:57 PM     Lipids: No results found for: LDLCALC, LDLDIRECT, CHOL, TRIG, HDL     ASSESSMENT AND PLAN: 1.  Chest pain: Given recurrent episodes, I will obtain an exercise Myoview stress test.  I offered ordering a Lexiscan but she is scared of this as she had an adverse reaction in the past.  I will also aim to control blood pressure.  2.  Hypertension: I will start losartan 25 mg daily.  She had taken this in the past.  She is medically noncompliant.  3.  SVT: Symptomatically stable.  4.  Lower extremity venous insufficiency: I previously recommended conservative measures such as weight loss with physical activity and compression stockings.   Disposition: Follow up 6 weeks.   Prentice DockerSuresh Decorian Schuenemann, M.D., F.A.C.C.

## 2017-10-15 NOTE — Patient Instructions (Signed)
Medication Instructions:   Restart the Losartan 25mg  daily.  Continue all other medications.    Labwork: none  Testing/Procedures:  Your physician has requested that you have an exercise stress myoview. For further information please visit [email or web address]. Please follow instruction sheet, as given.  Office will contact with results via phone or letter.    Follow-Up: 6 weeks   Any Other Special Instructions Will Be Listed Below (If Applicable).  If you need a refill on your cardiac medications before your next appointment, please call your pharmacy.

## 2017-10-17 DIAGNOSIS — H6123 Impacted cerumen, bilateral: Secondary | ICD-10-CM | POA: Diagnosis not present

## 2017-10-17 DIAGNOSIS — J069 Acute upper respiratory infection, unspecified: Secondary | ICD-10-CM | POA: Diagnosis not present

## 2017-10-17 DIAGNOSIS — Z6838 Body mass index (BMI) 38.0-38.9, adult: Secondary | ICD-10-CM | POA: Diagnosis not present

## 2017-10-20 DIAGNOSIS — Z1231 Encounter for screening mammogram for malignant neoplasm of breast: Secondary | ICD-10-CM | POA: Diagnosis not present

## 2017-10-21 ENCOUNTER — Encounter (HOSPITAL_COMMUNITY): Payer: Medicare HMO

## 2017-11-24 DIAGNOSIS — J45909 Unspecified asthma, uncomplicated: Secondary | ICD-10-CM | POA: Diagnosis not present

## 2017-11-30 ENCOUNTER — Encounter (HOSPITAL_BASED_OUTPATIENT_CLINIC_OR_DEPARTMENT_OTHER)
Admission: RE | Admit: 2017-11-30 | Discharge: 2017-11-30 | Disposition: A | Discharge status: Home / Self Care | Payer: Medicare HMO | Source: Ambulatory Visit | Attending: Cardiovascular Disease | Admitting: Cardiovascular Disease

## 2017-11-30 ENCOUNTER — Encounter (HOSPITAL_COMMUNITY): Payer: Self-pay

## 2017-11-30 ENCOUNTER — Encounter (HOSPITAL_COMMUNITY)
Admission: RE | Admit: 2017-11-30 | Discharge: 2017-11-30 | Disposition: A | Discharge status: Home / Self Care | Payer: Medicare HMO | Source: Ambulatory Visit | Attending: Cardiovascular Disease | Admitting: Cardiovascular Disease

## 2017-11-30 ENCOUNTER — Ambulatory Visit: Payer: Medicare HMO | Admitting: Cardiovascular Disease

## 2017-11-30 DIAGNOSIS — R079 Chest pain, unspecified: Secondary | ICD-10-CM

## 2017-11-30 LAB — NM MYOCAR MULTI W/SPECT W/WALL MOTION / EF
CHL CUP NUCLEAR SDS: 2
CHL CUP NUCLEAR SRS: 2
CHL CUP NUCLEAR SSS: 4
CHL CUP RESTING HR STRESS: 79 {beats}/min
CSEPED: 3 min
CSEPEDS: 37 s
CSEPEW: 4.6 METS
CSEPHR: 89 %
LV dias vol: 64 mL (ref 46–106)
LVSYSVOL: 18 mL
MPHR: 169 {beats}/min
Peak HR: 151 {beats}/min
RATE: 0.29
RPE: 11
TID: 0.95

## 2017-11-30 MED ORDER — SODIUM CHLORIDE 0.9% FLUSH
INTRAVENOUS | Status: AC
  Administered 2017-11-30: 10 mL via INTRAVENOUS
  Filled 2017-11-30: qty 10

## 2017-11-30 MED ORDER — TECHNETIUM TC 99M TETROFOSMIN IV KIT
30.0000 | PACK | Freq: Once | INTRAVENOUS | Status: AC | PRN
  Administered 2017-11-30: 33 via INTRAVENOUS

## 2017-11-30 MED ORDER — REGADENOSON 0.4 MG/5ML IV SOLN
INTRAVENOUS | Status: AC
  Filled 2017-11-30: qty 5

## 2017-11-30 MED ORDER — TECHNETIUM TC 99M TETROFOSMIN IV KIT
10.0000 | PACK | Freq: Once | INTRAVENOUS | Status: AC | PRN
  Administered 2017-11-30: 11 via INTRAVENOUS

## 2017-12-25 ENCOUNTER — Ambulatory Visit: Payer: Medicare HMO | Admitting: Cardiovascular Disease

## 2017-12-25 ENCOUNTER — Other Ambulatory Visit: Payer: Self-pay

## 2017-12-25 ENCOUNTER — Encounter: Payer: Self-pay | Admitting: Cardiovascular Disease

## 2017-12-25 VITALS — BP 118/78 | HR 84 | Ht 65.0 in | Wt 234.0 lb

## 2017-12-25 DIAGNOSIS — I10 Essential (primary) hypertension: Secondary | ICD-10-CM

## 2017-12-25 DIAGNOSIS — I471 Supraventricular tachycardia: Secondary | ICD-10-CM | POA: Diagnosis not present

## 2017-12-25 DIAGNOSIS — R079 Chest pain, unspecified: Secondary | ICD-10-CM

## 2017-12-25 DIAGNOSIS — I8393 Asymptomatic varicose veins of bilateral lower extremities: Secondary | ICD-10-CM

## 2017-12-25 NOTE — Patient Instructions (Signed)
Medication Instructions:  Continue all current medications.  Labwork: none  Testing/Procedures: none  Follow-Up: As needed.    Any Other Special Instructions Will Be Listed Below (If Applicable).  If you need a refill on your cardiac medications before your next appointment, please call your pharmacy.  

## 2017-12-25 NOTE — Progress Notes (Signed)
SUBJECTIVE: The patient returns for follow-up after undergoing cardiovascular testing performed for the evaluation of chest pain.  She also has a history of hypertension, SVT, migraines, fibromyalgia, varicose veins, and noncardiac chest pain.  She underwent a normal nuclear stress test on 11/30/17, LVEF greater than 65%.  She continues to have episodic chest pains primarily at night.  They resolve with sleep.  She denies palpitations.  She said she loves to eat potato chips, Jamaica[place] fries, and salt.   Review of Systems: As per "subjective", otherwise negative.  Allergies  Allergen Reactions  . Dilaudid [Hydromorphone Hcl]   . Hydrocodone   . Lisinopril Cough  . Morphine And Related     Decreased respirations  . Latex Rash    Current Outpatient Medications  Medication Sig Dispense Refill  . albuterol (PROVENTIL HFA;VENTOLIN HFA) 108 (90 BASE) MCG/ACT inhaler Inhale 2 puffs into the lungs every 6 (six) hours as needed for wheezing.    Marland Kitchen. ALPRAZolam (XANAX) 0.5 MG tablet Take 0.5 mg by mouth at bedtime as needed. For sleep or anxiety    . losartan (COZAAR) 25 MG tablet Take 1 tablet (25 mg total) by mouth daily. 90 tablet 3  . ranitidine (ZANTAC) 150 MG tablet TAKE 1 TABLET BY MOUTH TWICE A DAY FOR ACID REFLUX  1   No current facility-administered medications for this visit.     Past Medical History:  Diagnosis Date  . Asthma   . Bradycardia   . Degenerative disc disease   . Depression   . Fibromyalgia   . Gastroesophageal reflux disease   . Hypertension   . Migraine   . Obesity   . PAC (premature atrial contraction)     Past Surgical History:  Procedure Laterality Date  . ACHILLES TENDON REPAIR Left   . GALLBLADDER SURGERY      Social History   Socioeconomic History  . Marital status: Married    Spouse name: Not on file  . Number of children: Not on file  . Years of education: Not on file  . Highest education level: Not on file  Social Needs  .  Financial resource strain: Not on file  . Food insecurity - worry: Not on file  . Food insecurity - inability: Not on file  . Transportation needs - medical: Not on file  . Transportation needs - non-medical: Not on file  Occupational History  . Not on file  Tobacco Use  . Smoking status: Former Smoker    Last attempt to quit: 01/31/1992    Years since quitting: 25.9  . Smokeless tobacco: Never Used  Substance and Sexual Activity  . Alcohol use: No    Alcohol/week: 0.0 oz  . Drug use: No  . Sexual activity: Not on file  Other Topics Concern  . 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  She is on disability for ?bipolar disorder and fibromyalgia in January 2012.   She lives at home with her husband.   Highest level of education:  Associated degree.      Vitals:   12/25/17 1521  BP: 118/78  Pulse: 84  SpO2: 96%  Weight: 234 lb (106.1 kg)  Height: 5\' 5"  (1.651 m)    Wt Readings from Last 3 Encounters:  12/25/17 234 lb (106.1 kg)  10/15/17 233 lb (105.7 kg)  08/05/16 233 lb (105.7 kg)     PHYSICAL EXAM  General: NAD HEENT: Normal. Neck: No JVD, no thyromegaly. Lungs: Clear to auscultation bilaterally with normal respiratory effort. CV: Regular rate and rhythm, normal S1/S2, no S3/S4, no murmur. No pretibial or periankle edema.  No carotid bruit.   Abdomen: Soft, nontender, no distention.  Neurologic: Alert and oriented.  Psych: Normal affect. Skin: Normal. Musculoskeletal: No gross deformities.    ECG: Most recent ECG reviewed.   Labs: Lab Results  Component Value Date/Time   K 3.3 (L) 05/15/2012 07:57 PM   BUN 6 05/15/2012 07:57 PM   CREATININE 0.69 05/15/2012 07:57 PM   ALT 28 05/15/2012 07:57 PM   HGB 13.7 05/15/2012 07:57 PM     Lipids: No results found for: LDLCALC, LDLDIRECT, CHOL, TRIG, HDL     ASSESSMENT AND PLAN: 1.  Chest pain: Normal nuclear stress test as noted above.   Symptoms are noncardiac.  No further cardiac testing is indicated at this time.  2.  Hypertension: Blood pressure is now controlled on losartan 25 mg daily.  No changes.  I educated her on the importance of a low-sodium diet.  3.  SVT: Symptomatically stable.  4.  Lower extremity venous insufficiency: I previously recommended conservative measures such as weight loss with physical activity and compression stockings.      Disposition: Follow up as needed   Prentice Docker, M.D., F.A.C.C.

## 2018-01-14 DIAGNOSIS — K219 Gastro-esophageal reflux disease without esophagitis: Secondary | ICD-10-CM | POA: Diagnosis not present

## 2018-01-14 DIAGNOSIS — Z9189 Other specified personal risk factors, not elsewhere classified: Secondary | ICD-10-CM | POA: Diagnosis not present

## 2018-01-14 DIAGNOSIS — J4520 Mild intermittent asthma, uncomplicated: Secondary | ICD-10-CM | POA: Diagnosis not present

## 2018-01-14 DIAGNOSIS — R69 Illness, unspecified: Secondary | ICD-10-CM | POA: Diagnosis not present

## 2018-01-14 DIAGNOSIS — G43909 Migraine, unspecified, not intractable, without status migrainosus: Secondary | ICD-10-CM | POA: Diagnosis not present

## 2018-01-14 DIAGNOSIS — J301 Allergic rhinitis due to pollen: Secondary | ICD-10-CM | POA: Diagnosis not present

## 2018-01-14 DIAGNOSIS — Z1212 Encounter for screening for malignant neoplasm of rectum: Secondary | ICD-10-CM | POA: Diagnosis not present

## 2018-01-14 DIAGNOSIS — Z0001 Encounter for general adult medical examination with abnormal findings: Secondary | ICD-10-CM | POA: Diagnosis not present

## 2018-03-03 DIAGNOSIS — M7662 Achilles tendinitis, left leg: Secondary | ICD-10-CM | POA: Diagnosis not present

## 2018-03-03 DIAGNOSIS — M7661 Achilles tendinitis, right leg: Secondary | ICD-10-CM | POA: Diagnosis not present

## 2018-03-03 DIAGNOSIS — J45909 Unspecified asthma, uncomplicated: Secondary | ICD-10-CM | POA: Diagnosis not present

## 2018-03-18 DIAGNOSIS — R600 Localized edema: Secondary | ICD-10-CM | POA: Diagnosis not present

## 2018-03-18 DIAGNOSIS — Z6838 Body mass index (BMI) 38.0-38.9, adult: Secondary | ICD-10-CM | POA: Diagnosis not present

## 2018-04-29 DIAGNOSIS — J45909 Unspecified asthma, uncomplicated: Secondary | ICD-10-CM | POA: Diagnosis not present

## 2018-05-13 DIAGNOSIS — K210 Gastro-esophageal reflux disease with esophagitis: Secondary | ICD-10-CM | POA: Diagnosis not present

## 2018-05-13 DIAGNOSIS — M797 Fibromyalgia: Secondary | ICD-10-CM | POA: Diagnosis not present

## 2018-05-13 DIAGNOSIS — Z9189 Other specified personal risk factors, not elsewhere classified: Secondary | ICD-10-CM | POA: Diagnosis not present

## 2018-05-13 DIAGNOSIS — I10 Essential (primary) hypertension: Secondary | ICD-10-CM | POA: Diagnosis not present

## 2018-05-13 DIAGNOSIS — E7801 Familial hypercholesterolemia: Secondary | ICD-10-CM | POA: Diagnosis not present

## 2018-05-17 DIAGNOSIS — J301 Allergic rhinitis due to pollen: Secondary | ICD-10-CM | POA: Diagnosis not present

## 2018-05-17 DIAGNOSIS — R69 Illness, unspecified: Secondary | ICD-10-CM | POA: Diagnosis not present

## 2018-05-17 DIAGNOSIS — G43909 Migraine, unspecified, not intractable, without status migrainosus: Secondary | ICD-10-CM | POA: Diagnosis not present

## 2018-05-17 DIAGNOSIS — K219 Gastro-esophageal reflux disease without esophagitis: Secondary | ICD-10-CM | POA: Diagnosis not present

## 2018-05-17 DIAGNOSIS — Z6838 Body mass index (BMI) 38.0-38.9, adult: Secondary | ICD-10-CM | POA: Diagnosis not present

## 2018-05-17 DIAGNOSIS — J4520 Mild intermittent asthma, uncomplicated: Secondary | ICD-10-CM | POA: Diagnosis not present

## 2018-05-17 DIAGNOSIS — F4001 Agoraphobia with panic disorder: Secondary | ICD-10-CM | POA: Diagnosis not present

## 2018-06-21 DIAGNOSIS — J45909 Unspecified asthma, uncomplicated: Secondary | ICD-10-CM | POA: Diagnosis not present

## 2018-06-29 DIAGNOSIS — R300 Dysuria: Secondary | ICD-10-CM | POA: Diagnosis not present

## 2018-06-29 DIAGNOSIS — N3001 Acute cystitis with hematuria: Secondary | ICD-10-CM | POA: Diagnosis not present

## 2018-06-29 DIAGNOSIS — Z6838 Body mass index (BMI) 38.0-38.9, adult: Secondary | ICD-10-CM | POA: Diagnosis not present

## 2018-08-02 DIAGNOSIS — K219 Gastro-esophageal reflux disease without esophagitis: Secondary | ICD-10-CM | POA: Diagnosis not present

## 2018-08-02 DIAGNOSIS — I10 Essential (primary) hypertension: Secondary | ICD-10-CM | POA: Diagnosis not present

## 2018-08-03 DIAGNOSIS — Z23 Encounter for immunization: Secondary | ICD-10-CM | POA: Diagnosis not present

## 2018-09-01 DIAGNOSIS — I10 Essential (primary) hypertension: Secondary | ICD-10-CM | POA: Diagnosis not present

## 2018-09-01 DIAGNOSIS — J4520 Mild intermittent asthma, uncomplicated: Secondary | ICD-10-CM | POA: Diagnosis not present

## 2018-09-01 DIAGNOSIS — K219 Gastro-esophageal reflux disease without esophagitis: Secondary | ICD-10-CM | POA: Diagnosis not present

## 2018-09-01 DIAGNOSIS — R69 Illness, unspecified: Secondary | ICD-10-CM | POA: Diagnosis not present

## 2018-11-04 DIAGNOSIS — Z6838 Body mass index (BMI) 38.0-38.9, adult: Secondary | ICD-10-CM | POA: Diagnosis not present

## 2018-11-04 DIAGNOSIS — M1811 Unilateral primary osteoarthritis of first carpometacarpal joint, right hand: Secondary | ICD-10-CM | POA: Diagnosis not present

## 2018-11-04 DIAGNOSIS — G5621 Lesion of ulnar nerve, right upper limb: Secondary | ICD-10-CM | POA: Diagnosis not present

## 2018-11-18 DIAGNOSIS — Z1231 Encounter for screening mammogram for malignant neoplasm of breast: Secondary | ICD-10-CM | POA: Diagnosis not present

## 2018-11-26 DIAGNOSIS — K210 Gastro-esophageal reflux disease with esophagitis: Secondary | ICD-10-CM | POA: Diagnosis not present

## 2018-11-26 DIAGNOSIS — E7800 Pure hypercholesterolemia, unspecified: Secondary | ICD-10-CM | POA: Diagnosis not present

## 2018-11-26 DIAGNOSIS — I10 Essential (primary) hypertension: Secondary | ICD-10-CM | POA: Diagnosis not present

## 2018-11-26 DIAGNOSIS — E039 Hypothyroidism, unspecified: Secondary | ICD-10-CM | POA: Diagnosis not present

## 2018-12-02 DIAGNOSIS — K219 Gastro-esophageal reflux disease without esophagitis: Secondary | ICD-10-CM | POA: Diagnosis not present

## 2018-12-02 DIAGNOSIS — I10 Essential (primary) hypertension: Secondary | ICD-10-CM | POA: Diagnosis not present

## 2018-12-31 DIAGNOSIS — K219 Gastro-esophageal reflux disease without esophagitis: Secondary | ICD-10-CM | POA: Diagnosis not present

## 2018-12-31 DIAGNOSIS — I10 Essential (primary) hypertension: Secondary | ICD-10-CM | POA: Diagnosis not present

## 2019-01-13 ENCOUNTER — Encounter: Payer: Self-pay | Admitting: Gastroenterology

## 2019-01-13 DIAGNOSIS — E7800 Pure hypercholesterolemia, unspecified: Secondary | ICD-10-CM | POA: Diagnosis not present

## 2019-01-13 DIAGNOSIS — E7801 Familial hypercholesterolemia: Secondary | ICD-10-CM | POA: Diagnosis not present

## 2019-01-13 DIAGNOSIS — K210 Gastro-esophageal reflux disease with esophagitis: Secondary | ICD-10-CM | POA: Diagnosis not present

## 2019-01-13 DIAGNOSIS — R69 Illness, unspecified: Secondary | ICD-10-CM | POA: Diagnosis not present

## 2019-01-13 DIAGNOSIS — I10 Essential (primary) hypertension: Secondary | ICD-10-CM | POA: Diagnosis not present

## 2019-01-13 DIAGNOSIS — K580 Irritable bowel syndrome with diarrhea: Secondary | ICD-10-CM | POA: Diagnosis not present

## 2019-01-13 DIAGNOSIS — J45909 Unspecified asthma, uncomplicated: Secondary | ICD-10-CM | POA: Diagnosis not present

## 2019-01-17 DIAGNOSIS — I10 Essential (primary) hypertension: Secondary | ICD-10-CM | POA: Diagnosis not present

## 2019-01-17 DIAGNOSIS — Z6838 Body mass index (BMI) 38.0-38.9, adult: Secondary | ICD-10-CM | POA: Diagnosis not present

## 2019-01-17 DIAGNOSIS — G43909 Migraine, unspecified, not intractable, without status migrainosus: Secondary | ICD-10-CM | POA: Diagnosis not present

## 2019-01-17 DIAGNOSIS — J329 Chronic sinusitis, unspecified: Secondary | ICD-10-CM | POA: Diagnosis not present

## 2019-01-17 DIAGNOSIS — Z0001 Encounter for general adult medical examination with abnormal findings: Secondary | ICD-10-CM | POA: Diagnosis not present

## 2019-02-21 IMAGING — NM NM MYOCAR MULTI W/SPECT W/WALL MOTION & EF
2 series · 12 of 12 positions shown · non-contrast
Comparison: none

[Series 1: rest · 6.51mm/px · 6 of 64 frames shown]
[frame 6/64]
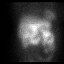
[frame 16/64]
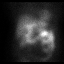
[frame 27/64]
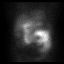
[frame 38/64]
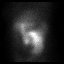
[frame 48/64]
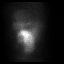
[frame 59/64]
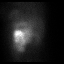

[Series 3: stress gated - perfusion · 6.51mm/px · 6 of 64 frames shown]
[frame 6/64]
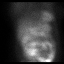
[frame 16/64]
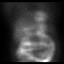
[frame 27/64]
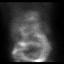
[frame 38/64]
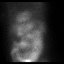
[frame 48/64]
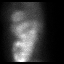
[frame 59/64]
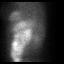

[12 of 12 positions shown; findings below may reference images not displayed]

Canned report from images found in remote index.

Refer to host system for actual result text.

## 2019-03-03 DIAGNOSIS — I10 Essential (primary) hypertension: Secondary | ICD-10-CM | POA: Diagnosis not present

## 2019-03-03 DIAGNOSIS — R69 Illness, unspecified: Secondary | ICD-10-CM | POA: Diagnosis not present

## 2019-03-24 ENCOUNTER — Other Ambulatory Visit: Payer: Self-pay

## 2019-03-24 ENCOUNTER — Encounter: Payer: Self-pay | Admitting: Nurse Practitioner

## 2019-03-24 ENCOUNTER — Ambulatory Visit (INDEPENDENT_AMBULATORY_CARE_PROVIDER_SITE_OTHER): Payer: Medicare HMO | Admitting: Nurse Practitioner

## 2019-03-24 DIAGNOSIS — Z86010 Personal history of colonic polyps: Secondary | ICD-10-CM

## 2019-03-24 DIAGNOSIS — R1310 Dysphagia, unspecified: Secondary | ICD-10-CM | POA: Insufficient documentation

## 2019-03-24 NOTE — Assessment & Plan Note (Signed)
The patient describes a history of dysphasia.  Her mother also had esophageal cancer.  She has had EGDs with dilations in the past, thinks her last one was about 2 or 3 years ago.  She is not currently having solid food dysphagia.  I recommended she notify us if she starts to have any solid food dysphagia and we can proceed with an EGD with possible dilation at that time.  She verbalized understanding.

## 2019-03-24 NOTE — Patient Instructions (Addendum)
Your health issues we discussed today were:   Need for colonoscopy: 1. Given your previous colonoscopy 5 years ago with multiple polyps we will schedule your colonoscopy 2. We will request your previous colonoscopy report and EGD report from [facility] 3. Further recommendations will be made based on the results of your colonoscopy 4. Call us if you have any solid food swallowing issues  Overall I recommend:  1. Continue your other medications otherwise 2. Return for follow-up based on recommendations made after your colonoscopy, or as needed for GI symptoms 3. Call us if you have any questions or concerns.   Because of recent events of COVID-19 ("Coronavirus"), follow CDC recommendations:  1. Wash your hand frequently 2. Avoid touching your face 3. Stay away from people who are sick 4. If you have symptoms such as fever, cough, shortness of breath then call your healthcare provider for further guidance 5. If you are sick, STAY AT HOME unless otherwise directed by your healthcare provider. 6. Follow directions from state and national officials regarding staying safe   At [facility] we value your feedback. You may receive a survey about your visit today. Please share your experience as we strive to create trusting relationships with our patients to provide genuine, compassionate, quality care.  We appreciate your understanding and patience as we review any laboratory studies, imaging, and other diagnostic tests that are ordered as we care for you. Our office policy is 5 business days for review of these results, and any emergent or urgent results are addressed in a timely manner for your best interest. If you do not hear from our office in 1 week, please contact us.   We also encourage the use of MyChart, which contains your medical information for your review as well. If you are not enrolled in this feature, an access code is on this after visit summary for your  convenience. Thank you for allowing [place] to be involved in your care.  It was great to see you today!  I hope you have a great day!!

## 2019-03-24 NOTE — Assessment & Plan Note (Signed)
The patient states her last colonoscopy was 5 years ago at [facility].  States it took out "several polyps" and recommended a 5-year repeat.  She is currently due.  We will request her previous colonoscopy and any associated pathology from [facility].  At this time we will proceed with colonoscopy as previously recommended.  Proceed with colonoscopy on propofol/MAC with Dr. Oneida Alar in the near future. The risks, benefits, and alternatives have been discussed in detail with the patient. They state understanding and desire to proceed.   The patient is currently on Xanax.  No other anticoagulants, anxiolytics, chronic pain medications, or antidepressants.  She is overweight with a BMI of 37.9.  Denies alcohol and drug use.  Given her medications and history we will plan for the procedure on propofol/MAC to promote adequate sedation.

## 2019-03-24 NOTE — Progress Notes (Signed)
Referring Provider: Richardean Chimera, MD Primary Care Physician:  Richardean Chimera, MD Primary GI:  Dr. Darrick Penna  NOTE: Service was provided via telemedicine and was requested by the patient due to COVID-19 pandemic.  Method of visit: Doxy.Me  Patient Location: Home  Provider Location: Office  Reason for Phone Visit: PCP Referral  The patient was consented to phone follow-up via telephone encounter including billing of the encounter (yes/no): Yes  Persons present on the phone encounter, with roles: None  Total time (minutes) spent on medical discussion: 22 minutes  Chief Complaint  Patient presents with  . Colonoscopy    last tcs approx 2015  . Dysphagia    dilation in past    HPI:   Alexa Wright is a 53 y.o. female who presents for virtual visit regarding: Referral from primary care for colonoscopy.  Nurse/phone triage was deferred to office visit due to medications likely necessitating augmented sedation.  Reviewed information provided with the referral including office visit dated 11/30/2018.  At that time noted history of GERD which is chronic and alleviated by Protonix a little.  Recommended referral to GI for colonoscopy due to a history of colon polyps with last colonoscopy dated 2013.  No history of colonoscopy in our system.  Today she states she's doing ok overall. Last colonoscopy was in 2015 at [facility]. She had "several polyps" and recommended repeat in 5 years. Has chronic IBS with diarrhea and uses Imodium so has intermittent abdoinal discomfort. Occasional associated nausea, no vomiting. Denies hematochezia, melena, fever, chills, unintentional weight loss. Has GERD and is on Pepcid (Zantac was recalled recently); pepcid does control her symptoms. Denies fever, chills, unintentional weight loss. Denies new/acute URI or flu-like symptoms. Denies loss of sense of taste or smell. Denies chest pain, dyspnea, dizziness, lightheadedness, syncope,  near syncope. Denies any other upper or lower GI symptoms.   Mother has a history of esophageal cancer and has had periodic EGD with dilation, cannot remember when her last was but thinks about 3 years ago. Is not having solid food dysphagia currently.  Subjective: height: 5'5", Wt: 128 lb; BMI Calc: 37.9  Past Medical History:  Diagnosis Date  . Asthma   . Bradycardia   . Degenerative disc disease   . Depression   . Fibromyalgia   . Gastroesophageal reflux disease   . Hypertension   . Migraine   . Obesity   . PAC (premature atrial contraction)     Past Surgical History:  Procedure Laterality Date  . ACHILLES TENDON REPAIR Left   . GALLBLADDER SURGERY      Current Outpatient Medications  Medication Sig Dispense Refill  . albuterol (ACCUNEB) 0.63 MG/3ML nebulizer solution Take 1 ampule by nebulization every 6 (six) hours as needed for wheezing.    Marland Kitchen ALPRAZolam (XANAX) 0.5 MG tablet Take 0.5 mg by mouth at bedtime as needed. For sleep or anxiety    . candesartan (ATACAND) 32 MG tablet Take 1 tablet by mouth daily.    . famotidine (PEPCID) 40 MG tablet Take 40 mg by mouth 2 (two) times daily.    Marland Kitchen losartan (COZAAR) 25 MG tablet Take 1 tablet (25 mg total) by mouth daily. (Patient not taking: Reported on 03/24/2019) 90 tablet 3   No current facility-administered medications for this visit.     Allergies as of 03/24/2019 - Review Complete 03/24/2019  Allergen Reaction Noted  . Dilaudid [hydromorphone hcl]  05/15/2012  . Hydrocodone  05/15/2012  .  Lisinopril Cough 03/08/2015  . Morphine and related  03/04/2013  . Latex Rash 03/04/2013    Family History  Problem Relation Age of Onset  . Esophageal cancer Mother        Living  . Heart attack Father        Living  . Mental retardation Sister   . Migraines Sister   . Migraines Sister   . Migraines Daughter   . Colon cancer Neg Hx   . Gastric cancer Neg Hx     Social History   Socioeconomic History  . Marital  status: Married    Spouse name: Not on file  . Number of children: Not on file  . Years of education: Not on file  . Highest education level: Not on file  Occupational History  . Not on file  Social Needs  . Financial resource strain: Not on file  . Food insecurity:    Worry: Not on file    Inability: Not on file  . Transportation needs:    Medical: Not on file    Non-medical: Not on file  Tobacco Use  . Smoking status: Former Smoker    Last attempt to quit: 01/31/1992    Years since quitting: 27.1  . Smokeless tobacco: Never Used  Substance and Sexual Activity  . Alcohol use: No    Alcohol/week: 0.0 standard drinks  . Drug use: No  . Sexual activity: Not on file  Lifestyle  . Physical activity:    Days per week: Not on file    Minutes per session: Not on file  . Stress: Not on file  Relationships  . Social connections:    Talks on phone: Not on file    Gets together: Not on file    Attends religious service: Not on file    Active member of club or organization: Not on file    Attends meetings of clubs or organizations: Not on file    Relationship status: Not on file  Other Topics Concern  . 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  She is on disability for ?bipolar disorder and fibromyalgia in January 2012.   She lives at home with her husband.   Highest level of education:  Associated degree.     Review of Systems: Complete ROS negative except as per HPI.  Physical Exam: Note: limited exam due to virtual visit General:   Alert and oriented. Pleasant and cooperative. Well-nourished and well-developed.  Head:  Normocephalic and atraumatic. Eyes:  Without icterus, sclera clear and conjunctiva pink.  Ears:  Normal auditory acuity. Skin:  Intact without facial significant lesions or rashes. Neurologic:  Alert and oriented x4;  grossly normal neurologically. Psych:  Alert and cooperative. Normal mood  and affect. Heme/Lymph/Immune: No excessive bruising noted.

## 2019-03-24 NOTE — Progress Notes (Signed)
cc'ed to pcp °

## 2019-04-04 ENCOUNTER — Telehealth: Payer: Self-pay | Admitting: *Deleted

## 2019-04-04 NOTE — Telephone Encounter (Signed)
LMOVM to call back to schedule TCS with propofol with [facility] 

## 2019-04-05 NOTE — Telephone Encounter (Signed)
Tried to call pt, no answer, LMOVM. Letter mailed.

## 2021-05-15 ENCOUNTER — Encounter: Payer: Self-pay | Admitting: Internal Medicine

## 2021-05-31 ENCOUNTER — Encounter: Payer: Self-pay | Admitting: Internal Medicine

## 2021-07-04 ENCOUNTER — Ambulatory Visit: Payer: Medicare HMO | Admitting: Internal Medicine

## 2021-07-18 ENCOUNTER — Ambulatory Visit (INDEPENDENT_AMBULATORY_CARE_PROVIDER_SITE_OTHER): Payer: Medicare HMO | Admitting: Internal Medicine

## 2021-07-18 ENCOUNTER — Other Ambulatory Visit: Payer: Self-pay

## 2021-07-18 ENCOUNTER — Encounter: Payer: Self-pay | Admitting: Internal Medicine

## 2021-07-18 VITALS — BP 144/98 | HR 83 | Temp 97.3°F | Ht 66.0 in | Wt 241.2 lb

## 2021-07-18 DIAGNOSIS — R1310 Dysphagia, unspecified: Secondary | ICD-10-CM | POA: Diagnosis not present

## 2021-07-18 DIAGNOSIS — Z86010 Personal history of colonic polyps: Secondary | ICD-10-CM | POA: Diagnosis not present

## 2021-07-18 DIAGNOSIS — K219 Gastro-esophageal reflux disease without esophagitis: Secondary | ICD-10-CM

## 2021-07-18 NOTE — Patient Instructions (Signed)
We will schedule you for upper endoscopy with possible esophageal dilation to further evaluate your difficulty swallowing and chronic reflux.  Continue on famotidine.  Further recommendations to follow.  It was very nice meeting you today.  Dr. Marletta Lor  Lifestyle and home remedies TO MANAGE REFLUX/HEARTBURN    You may eliminate or reduce the frequency of heartburn by making the following lifestyle changes:   Control your weight. Being overweight is a major risk factor for heartburn and GERD. Excess pounds put pressure on your abdomen, pushing up your stomach and causing acid to back up into your esophagus.    Eat smaller meals. 4 TO 6 MEALS A DAY. This reduces pressure on the lower esophageal sphincter, helping to prevent the valve from opening and acid from washing back into your esophagus.     Loosen your belt. Clothes that fit tightly around your waist put pressure on your abdomen and the lower esophageal sphincter.     Eliminate heartburn triggers. Everyone has specific triggers. Common triggers such as fatty or fried foods, spicy food, tomato sauce, carbonated beverages, alcohol, chocolate, mint, garlic, onion, caffeine and nicotine may make heartburn worse.    Avoid stooping or bending. Tying your shoes is OK. Bending over for longer periods to weed your garden isn't, especially soon after eating.    Don't lie down after a meal. Wait at least three to four hours after eating before going to bed, and don't lie down right after eating.    At [facility] we value your feedback. You may receive a survey about your visit today. Please share your experience as we strive to create trusting relationships with our patients to provide genuine, compassionate, quality care.  We appreciate your understanding and patience as we review any laboratory studies, imaging, and other diagnostic tests that are ordered as we care for you. Our office policy is 5 business days for review of  these results, and any emergent or urgent results are addressed in a timely manner for your best interest. If you do not hear from our office in 1 week, please contact us.   We also encourage the use of MyChart, which contains your medical information for your review as well. If you are not enrolled in this feature, an access code is on this after visit summary for your convenience. Thank you for allowing [place] to be involved in your care.  It was great to see you today!  I hope you have a great rest of your summer!!    Hennie Duos. Marletta Lor, D.O. Gastroenterology and Hepatology [facility]

## 2021-07-18 NOTE — H&P (View-Only) (Signed)
Referring Provider: Richardean Chimera, MD Primary Care Physician:  Richardean Chimera, MD Primary GI:  Dr. Marletta Lor  Chief Complaint  Patient presents with   Dysphagia    Trouble with solid foods mostly, had esoph stretch about 3-4 years ago in [place]. Has had it done about 2-3 times now.    HPI:   Alexa Wright is a 55 y.o. female who presents to clinic today for follow-up visit.  Previously seen May 2020 to discuss colonoscopy for surveillance purposes.  This was canceled given COVID pandemic.  Patient underwent colonoscopy by Dr. Aurea Graff 03/22/2020 with 1 polyp removed and recommended 5-year recall.  Patient has chronic GERD which is relatively well controlled on famotidine 40 mg twice daily.  She has a history of chronic dysphagia as well.  She required esophageal dilation in 2016 which she states helped her symptoms.  She notes that this is progressively worsened as of late.  Has not been eating much due to fear of food getting stuck.  Notes frequent coughing as well.  No regurgitation of foods.  No melena hematochezia.  No chronic NSAID use.  Past Medical History:  Diagnosis Date   Asthma    Bradycardia    Degenerative disc disease    Depression    Fibromyalgia    Gastroesophageal reflux disease    Hypertension    Migraine    Obesity    PAC (premature atrial contraction)     Past Surgical History:  Procedure Laterality Date   ACHILLES TENDON REPAIR Left    GALLBLADDER SURGERY      Current Outpatient Medications  Medication Sig Dispense Refill   ALPRAZolam (XANAX) 0.5 MG tablet Take 0.5 mg by mouth at bedtime as needed. For sleep or anxiety     famotidine (PEPCID) 40 MG tablet Take 40 mg by mouth 2 (two) times daily.     No current facility-administered medications for this visit.    Allergies as of 07/18/2021 - Review Complete 07/18/2021  Allergen Reaction Noted   Dilaudid [hydromorphone hcl]  05/15/2012   Hydrocodone  05/15/2012   Lisinopril Cough 03/08/2015    Morphine and related  03/04/2013   Latex Rash 03/04/2013    Family History  Problem Relation Age of Onset   Esophageal cancer Mother        Living   Heart attack Father        Living   Mental retardation Sister    Migraines Sister    Migraines Sister    Migraines Daughter    Colon cancer Neg Hx    Gastric cancer Neg Hx     Social History   Socioeconomic History   Marital status: Married    Spouse name: Not on file   Number of children: Not on file   Years of education: Not on file   Highest education level: Not on file  Occupational History   Not on file  Tobacco Use   Smoking status: Former    Types: Cigarettes    Quit date: 01/31/1992    Years since quitting: 29.4   Smokeless tobacco: Never  Substance and Sexual Activity   Alcohol use: No    Alcohol/week: 0.0 standard drinks   Drug use: No   Sexual activity: Not on file  Other Topics Concern   Not on file  Social History Narrative   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  She is on disability for ?bipolar disorder and  fibromyalgia in January 2012.   She lives at home with her husband.   Highest level of education:  Associated degree.    Social Determinants of Health   Financial Resource Strain: Not on file  Food Insecurity: Not on file  Transportation Needs: Not on file  Physical Activity: Not on file  Stress: Not on file  Social Connections: Not on file    Subjective: Review of Systems  Constitutional:  Negative for chills and fever.  HENT:  Negative for congestion and hearing loss.   Eyes:  Negative for blurred vision and double vision.  Respiratory:  Negative for cough and shortness of breath.   Cardiovascular:  Negative for chest pain and palpitations.  Gastrointestinal:  Positive for heartburn. Negative for abdominal pain, blood in stool, constipation, diarrhea, melena and vomiting.       Dysphagia  Genitourinary:  Negative for dysuria and urgency.  Musculoskeletal:   Negative for joint pain and myalgias.  Skin:  Negative for itching and rash.  Neurological:  Negative for dizziness and headaches.  Psychiatric/Behavioral:  Negative for depression. The patient is not nervous/anxious.     Objective: BP (!) 144/98   Pulse 83   Temp (!) 97.3 F (36.3 C)   Ht 5\' 6"  (1.676 m)   Wt 241 lb 3.2 oz (109.4 kg)   LMP 03/10/2019 (Approximate)   BMI 38.93 kg/m  Physical Exam Constitutional:      Appearance: Normal appearance.  HENT:     Head: Normocephalic and atraumatic.  Eyes:     Extraocular Movements: Extraocular movements intact.     Conjunctiva/sclera: Conjunctivae normal.  Cardiovascular:     Rate and Rhythm: Normal rate and regular rhythm.  Pulmonary:     Effort: Pulmonary effort is normal.     Breath sounds: Normal breath sounds.  Abdominal:     General: Bowel sounds are normal.     Palpations: Abdomen is soft.  Musculoskeletal:        General: No swelling. Normal range of motion.     Cervical back: Normal range of motion and neck supple.  Skin:    General: Skin is warm and dry.     Coloration: Skin is not jaundiced.  Neurological:     General: No focal deficit present.     Mental Status: She is alert and oriented to person, place, and time.  Psychiatric:        Mood and Affect: Mood normal.        Behavior: Behavior normal.     Assessment: *Chronic GERD-relatively well controlled on Pepcid *Dysphagia-chronic, worsening *Adenomatous colon polyp  Plan: Will schedule for EGD with possible dilation to evaluate for peptic ulcer disease, esophagitis, gastritis, H. Pylori, duodenitis, or other. Will also evaluate for esophageal stricture, Schatzki's ring, esophageal web or other.   The risks including infection, bleed, or perforation as well as benefits, limitations, alternatives and imponderables have been reviewed with the patient. Potential for esophageal dilation, biopsy, etc. have also been reviewed.  Questions have been answered.  All parties agreeable.  Continue on famotidine twice daily for chronic GERD.  Previously on omeprazole which she states did not work as well.  We may need to consider different PPI therapy pending endoscopic findings.  Colonoscopy recall 2026 due to adenomatous colon polyps.  07/18/2021 9:42 AM   Disclaimer: This note was dictated with voice recognition software. Similar sounding words can inadvertently be transcribed and may not be corrected upon review.

## 2021-07-18 NOTE — Progress Notes (Signed)
Referring Provider: Richardean Chimera, MD Primary Care Physician:  Richardean Chimera, MD Primary GI:  Dr. Marletta Lor  Chief Complaint  Patient presents with   Dysphagia    Trouble with solid foods mostly, had esoph stretch about 3-4 years ago in [place]. Has had it done about 2-3 times now.    HPI:   Alexa Wright is a 55 y.o. female who presents to clinic today for follow-up visit.  Previously seen May 2020 to discuss colonoscopy for surveillance purposes.  This was canceled given COVID pandemic.  Patient underwent colonoscopy by Dr. Aurea Graff 03/22/2020 with 1 polyp removed and recommended 5-year recall.  Patient has chronic GERD which is relatively well controlled on famotidine 40 mg twice daily.  She has a history of chronic dysphagia as well.  She required esophageal dilation in 2016 which she states helped her symptoms.  She notes that this is progressively worsened as of late.  Has not been eating much due to fear of food getting stuck.  Notes frequent coughing as well.  No regurgitation of foods.  No melena hematochezia.  No chronic NSAID use.  Past Medical History:  Diagnosis Date   Asthma    Bradycardia    Degenerative disc disease    Depression    Fibromyalgia    Gastroesophageal reflux disease    Hypertension    Migraine    Obesity    PAC (premature atrial contraction)     Past Surgical History:  Procedure Laterality Date   ACHILLES TENDON REPAIR Left    GALLBLADDER SURGERY      Current Outpatient Medications  Medication Sig Dispense Refill   ALPRAZolam (XANAX) 0.5 MG tablet Take 0.5 mg by mouth at bedtime as needed. For sleep or anxiety     famotidine (PEPCID) 40 MG tablet Take 40 mg by mouth 2 (two) times daily.     No current facility-administered medications for this visit.    Allergies as of 07/18/2021 - Review Complete 07/18/2021  Allergen Reaction Noted   Dilaudid [hydromorphone hcl]  05/15/2012   Hydrocodone  05/15/2012   Lisinopril Cough 03/08/2015    Morphine and related  03/04/2013   Latex Rash 03/04/2013    Family History  Problem Relation Age of Onset   Esophageal cancer Mother        Living   Heart attack Father        Living   Mental retardation Sister    Migraines Sister    Migraines Sister    Migraines Daughter    Colon cancer Neg Hx    Gastric cancer Neg Hx     Social History   Socioeconomic History   Marital status: Married    Spouse name: Not on file   Number of children: Not on file   Years of education: Not on file   Highest education level: Not on file  Occupational History   Not on file  Tobacco Use   Smoking status: Former    Types: Cigarettes    Quit date: 01/31/1992    Years since quitting: 29.4   Smokeless tobacco: Never  Substance and Sexual Activity   Alcohol use: No    Alcohol/week: 0.0 standard drinks   Drug use: No   Sexual activity: Not on file  Other Topics Concern   Not on file  Social History Narrative   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  She is on disability for ?bipolar disorder and  fibromyalgia in January 2012.   She lives at home with her husband.   Highest level of education:  Associated degree.    Social Determinants of Health   Financial Resource Strain: Not on file  Food Insecurity: Not on file  Transportation Needs: Not on file  Physical Activity: Not on file  Stress: Not on file  Social Connections: Not on file    Subjective: Review of Systems  Constitutional:  Negative for chills and fever.  HENT:  Negative for congestion and hearing loss.   Eyes:  Negative for blurred vision and double vision.  Respiratory:  Negative for cough and shortness of breath.   Cardiovascular:  Negative for chest pain and palpitations.  Gastrointestinal:  Positive for heartburn. Negative for abdominal pain, blood in stool, constipation, diarrhea, melena and vomiting.       Dysphagia  Genitourinary:  Negative for dysuria and urgency.  Musculoskeletal:   Negative for joint pain and myalgias.  Skin:  Negative for itching and rash.  Neurological:  Negative for dizziness and headaches.  Psychiatric/Behavioral:  Negative for depression. The patient is not nervous/anxious.     Objective: BP (!) 144/98   Pulse 83   Temp (!) 97.3 F (36.3 C)   Ht 5\' 6"  (1.676 m)   Wt 241 lb 3.2 oz (109.4 kg)   LMP 03/10/2019 (Approximate)   BMI 38.93 kg/m  Physical Exam Constitutional:      Appearance: Normal appearance.  HENT:     Head: Normocephalic and atraumatic.  Eyes:     Extraocular Movements: Extraocular movements intact.     Conjunctiva/sclera: Conjunctivae normal.  Cardiovascular:     Rate and Rhythm: Normal rate and regular rhythm.  Pulmonary:     Effort: Pulmonary effort is normal.     Breath sounds: Normal breath sounds.  Abdominal:     General: Bowel sounds are normal.     Palpations: Abdomen is soft.  Musculoskeletal:        General: No swelling. Normal range of motion.     Cervical back: Normal range of motion and neck supple.  Skin:    General: Skin is warm and dry.     Coloration: Skin is not jaundiced.  Neurological:     General: No focal deficit present.     Mental Status: She is alert and oriented to person, place, and time.  Psychiatric:        Mood and Affect: Mood normal.        Behavior: Behavior normal.     Assessment: *Chronic GERD-relatively well controlled on Pepcid *Dysphagia-chronic, worsening *Adenomatous colon polyp  Plan: Will schedule for EGD with possible dilation to evaluate for peptic ulcer disease, esophagitis, gastritis, H. Pylori, duodenitis, or other. Will also evaluate for esophageal stricture, Schatzki's ring, esophageal web or other.   The risks including infection, bleed, or perforation as well as benefits, limitations, alternatives and imponderables have been reviewed with the patient. Potential for esophageal dilation, biopsy, etc. have also been reviewed.  Questions have been answered.  All parties agreeable.  Continue on famotidine twice daily for chronic GERD.  Previously on omeprazole which she states did not work as well.  We may need to consider different PPI therapy pending endoscopic findings.  Colonoscopy recall 2026 due to adenomatous colon polyps.  07/18/2021 9:42 AM   Disclaimer: This note was dictated with voice recognition software. Similar sounding words can inadvertently be transcribed and may not be corrected upon review.

## 2021-08-06 ENCOUNTER — Ambulatory Visit (HOSPITAL_COMMUNITY): Payer: Medicare HMO | Admitting: Anesthesiology

## 2021-08-06 ENCOUNTER — Other Ambulatory Visit: Payer: Self-pay

## 2021-08-06 ENCOUNTER — Encounter (HOSPITAL_COMMUNITY): Payer: Self-pay

## 2021-08-06 ENCOUNTER — Encounter (HOSPITAL_COMMUNITY)
Admission: RE | Disposition: A | Discharge status: Home / Self Care | Payer: Self-pay | Source: Ambulatory Visit | Attending: Internal Medicine

## 2021-08-06 ENCOUNTER — Ambulatory Visit (HOSPITAL_COMMUNITY)
Admission: RE | Admit: 2021-08-06 | Discharge: 2021-08-06 | Disposition: A | Discharge status: Home / Self Care | Payer: Medicare HMO | Source: Ambulatory Visit | Attending: Internal Medicine | Admitting: Internal Medicine

## 2021-08-06 DIAGNOSIS — M797 Fibromyalgia: Secondary | ICD-10-CM | POA: Insufficient documentation

## 2021-08-06 DIAGNOSIS — J45909 Unspecified asthma, uncomplicated: Secondary | ICD-10-CM | POA: Insufficient documentation

## 2021-08-06 DIAGNOSIS — K2970 Gastritis, unspecified, without bleeding: Secondary | ICD-10-CM

## 2021-08-06 DIAGNOSIS — I10 Essential (primary) hypertension: Secondary | ICD-10-CM | POA: Insufficient documentation

## 2021-08-06 DIAGNOSIS — K2289 Other specified disease of esophagus: Secondary | ICD-10-CM | POA: Diagnosis not present

## 2021-08-06 DIAGNOSIS — Z87891 Personal history of nicotine dependence: Secondary | ICD-10-CM | POA: Insufficient documentation

## 2021-08-06 DIAGNOSIS — K319 Disease of stomach and duodenum, unspecified: Secondary | ICD-10-CM | POA: Diagnosis not present

## 2021-08-06 DIAGNOSIS — Z6838 Body mass index (BMI) 38.0-38.9, adult: Secondary | ICD-10-CM | POA: Diagnosis not present

## 2021-08-06 DIAGNOSIS — K219 Gastro-esophageal reflux disease without esophagitis: Secondary | ICD-10-CM | POA: Diagnosis not present

## 2021-08-06 DIAGNOSIS — Z79899 Other long term (current) drug therapy: Secondary | ICD-10-CM | POA: Insufficient documentation

## 2021-08-06 DIAGNOSIS — Z888 Allergy status to other drugs, medicaments and biological substances status: Secondary | ICD-10-CM | POA: Diagnosis not present

## 2021-08-06 DIAGNOSIS — R1310 Dysphagia, unspecified: Secondary | ICD-10-CM

## 2021-08-06 HISTORY — PX: BIOPSY: SHX5522

## 2021-08-06 HISTORY — PX: BALLOON DILATION: SHX5330

## 2021-08-06 HISTORY — PX: ESOPHAGOGASTRODUODENOSCOPY (EGD) WITH PROPOFOL: SHX5813

## 2021-08-06 SURGERY — ESOPHAGOGASTRODUODENOSCOPY (EGD) WITH PROPOFOL
Anesthesia: General

## 2021-08-06 MED ORDER — STERILE WATER FOR IRRIGATION IR SOLN
Status: DC | PRN
  Administered 2021-08-06: 100 mL

## 2021-08-06 MED ORDER — LIDOCAINE HCL (CARDIAC) PF 100 MG/5ML IV SOSY
PREFILLED_SYRINGE | INTRAVENOUS | Status: DC | PRN
  Administered 2021-08-06: 50 mg via INTRAVENOUS

## 2021-08-06 MED ORDER — LACTATED RINGERS IV SOLN: INTRAVENOUS | Status: DC

## 2021-08-06 MED ORDER — PROPOFOL 10 MG/ML IV BOLUS
INTRAVENOUS | Status: DC | PRN
Start: 2021-08-06 — End: 2021-08-06
  Administered 2021-08-06: 40 mg via INTRAVENOUS
  Administered 2021-08-06: 130 mg via INTRAVENOUS
  Administered 2021-08-06: 30 mg via INTRAVENOUS

## 2021-08-06 MED ORDER — PANTOPRAZOLE SODIUM 40 MG PO TBEC: 40.0000 mg | DELAYED_RELEASE_TABLET | Freq: Two times a day (BID) | ORAL | 11 refills | Status: DC

## 2021-08-06 NOTE — Discharge Instructions (Addendum)
EGD Discharge instructions Please read the instructions outlined below and refer to this sheet in the next few weeks. These discharge instructions provide you with general information on caring for yourself after you leave the hospital. Your doctor may also give you specific instructions. While your treatment has been planned according to the most current medical practices available, unavoidable complications occasionally occur. If you have any problems or questions after discharge, please call your doctor. ACTIVITY You may resume your regular activity but move at a slower pace for the next 24 hours.  Take frequent rest periods for the next 24 hours.  Walking will help expel (get rid of) the air and reduce the bloated feeling in your abdomen.  No driving for 24 hours (because of the anesthesia (medicine) used during the test).  You may shower.  Do not sign any important legal documents or operate any machinery for 24 hours (because of the anesthesia used during the test).  NUTRITION Drink plenty of fluids.  You may resume your normal diet.  Begin with a light meal and progress to your normal diet.  Avoid alcoholic beverages for 24 hours or as instructed by your caregiver.  MEDICATIONS You may resume your normal medications unless your caregiver tells you otherwise.  WHAT YOU CAN EXPECT TODAY You may experience abdominal discomfort such as a feeling of fullness or "gas" pains.  FOLLOW-UP Your doctor will discuss the results of your test with you.  SEEK IMMEDIATE MEDICAL ATTENTION IF ANY OF THE FOLLOWING OCCUR: Excessive nausea (feeling sick to your stomach) and/or vomiting.  Severe abdominal pain and distention (swelling).  Trouble swallowing.  Temperature over 101 F (37.8 C).  Rectal bleeding or vomiting of blood.   Your EGD revealed mild amount inflammation in your stomach.  I took biopsies of this to rule out infection with a bacteria called H. pylori.  you also had a stricture in  your esophagus and findings consistent with a condition called eosinophilic esophagitis.  I took biopsies of your esophagus as well.  I did proceed with dilation to stretch your esophagus.  Hopefully this helps with your swallowing.  Await pathology results, my office will contact you.  I am going to start you on a new medication called pantoprazole 40 mg twice daily.  You can take famotidine as needed on top of this for breakthrough reflux symptoms.  We will likely need to repeat EGD in the near future though we can discuss further on follow-up visit with me in 2 to 3 months. The office will call you and schedule this appointment once they have Dr. Queen Blossom schedule available.     I hope you have a great rest of your week!  Hennie Duos. Marletta Lor, D.O. Gastroenterology and Hepatology [facility]

## 2021-08-06 NOTE — Anesthesia Preprocedure Evaluation (Signed)
Anesthesia Evaluation  Patient identified by MRN, date of birth, ID band Patient awake    Reviewed: Allergy & Precautions, H&P , NPO status , Patient's Chart, lab work & pertinent test results, reviewed documented beta blocker date and time   Airway Mallampati: II  TM Distance: >3 FB Neck ROM: full    Dental no notable dental hx.    Pulmonary asthma , former smoker,    Pulmonary exam normal breath sounds clear to auscultation       Cardiovascular Exercise Tolerance: Good hypertension, negative cardio ROS   Rhythm:regular Rate:Normal     Neuro/Psych  Headaches, PSYCHIATRIC DISORDERS Depression  Neuromuscular disease    GI/Hepatic Neg liver ROS, GERD  Medicated,  Endo/Other  Morbid obesity  Renal/GU negative Renal ROS  negative genitourinary   Musculoskeletal   Abdominal   Peds  Hematology negative hematology ROS (+)   Anesthesia Other Findings   Reproductive/Obstetrics negative OB ROS                             Anesthesia Physical Anesthesia Plan  ASA: 3  Anesthesia Plan: General   Post-op Pain Management:    Induction:   PONV Risk Score and Plan: Propofol infusion  Airway Management Planned:   Additional Equipment:   Intra-op Plan:   Post-operative Plan:   Informed Consent: I have reviewed the patients History and Physical, chart, labs and discussed the procedure including the risks, benefits and alternatives for the proposed anesthesia with the patient or authorized representative who has indicated his/her understanding and acceptance.     Dental Advisory Given  Plan Discussed with: CRNA  Anesthesia Plan Comments:         Anesthesia Quick Evaluation

## 2021-08-06 NOTE — Interval H&P Note (Signed)
History and Physical Interval Note:  08/06/2021 8:17 AM  Jerilee Field  has presented today for surgery, with the diagnosis of GERD, dysphagia.  The various methods of treatment have been discussed with the patient and family. After consideration of risks, benefits and other options for treatment, the patient has consented to  Procedure(s) with comments: ESOPHAGOGASTRODUODENOSCOPY (EGD) WITH PROPOFOL (N/A) - 8:15am BALLOON DILATION (N/A) as a surgical intervention.  The patient's history has been reviewed, patient examined, no change in status, stable for surgery.  I have reviewed the patient's chart and labs.  Questions were answered to the patient's satisfaction.     Lanelle Bal

## 2021-08-06 NOTE — Anesthesia Procedure Notes (Signed)
Date/Time: 08/06/2021 8:37 AM Performed by: Julian Reil, CRNA Pre-anesthesia Checklist: Patient identified, Emergency Drugs available, Suction available and Patient being monitored Patient Re-evaluated:Patient Re-evaluated prior to induction Oxygen Delivery Method: Nasal cannula Induction Type: IV induction Placement Confirmation: positive ETCO2

## 2021-08-06 NOTE — Transfer of Care (Signed)
Immediate Anesthesia Transfer of Care Note  Patient: Alexa Wright  Procedure(s) Performed: ESOPHAGOGASTRODUODENOSCOPY (EGD) WITH PROPOFOL BALLOON DILATION BIOPSY  Patient Location: Endoscopy Unit  Anesthesia Type:General  Level of Consciousness: drowsy  Airway & Oxygen Therapy: Patient Spontanous Breathing  Post-op Assessment: Report given to RN and Post -op Vital signs reviewed and stable  Post vital signs: Reviewed and stable  Last Vitals:  Vitals Value Taken Time  BP    Temp    Pulse    Resp    SpO2      Last Pain:  Vitals:   08/06/21 0830  TempSrc:   PainSc: 0-No pain      Patients Stated Pain Goal: 9 (08/06/21 0708)  Complications: No notable events documented.

## 2021-08-06 NOTE — Anesthesia Postprocedure Evaluation (Signed)
Anesthesia Post Note  Patient: Alexa Wright  Procedure(s) Performed: ESOPHAGOGASTRODUODENOSCOPY (EGD) WITH PROPOFOL BALLOON DILATION BIOPSY  Patient location during evaluation: Phase II Anesthesia Type: General Level of consciousness: awake Pain management: pain level controlled Vital Signs Assessment: post-procedure vital signs reviewed and stable Respiratory status: spontaneous breathing and respiratory function stable Cardiovascular status: blood pressure returned to baseline and stable Postop Assessment: no headache and no apparent nausea or vomiting Anesthetic complications: no Comments: Late entry   No notable events documented.   Last Vitals:  Vitals:   08/06/21 0708 08/06/21 0845  BP: (!) 148/97 124/63  Pulse: 90 88  Resp: 12 17  Temp: 36.6 C 36.9 C  SpO2: 98% 94%    Last Pain:  Vitals:   08/06/21 0845  TempSrc: Oral  PainSc: 0-No pain                 Windell Norfolk

## 2021-08-06 NOTE — Op Note (Signed)
[facility] Patient Name: Alexa Wright Procedure Date: 08/06/2021 8:21 AM MRN: [id number] Date of Birth: 11-24-1965 Attending MD: Elon Alas. Abbey Chatters DO CSN: [id number] Age: 55 Admit Type: Outpatient Procedure:                Upper GI endoscopy Indications:              Dysphagia Providers:                Elon Alas. Abbey Chatters, DO, Lambert Mody, Dereck Leep, Technician Referring MD:              Medicines:                See the Anesthesia note for documentation of the                            administered medications Complications:            No immediate complications. Estimated Blood Loss:     Estimated blood loss was minimal. Procedure:                Pre-Anesthesia Assessment:                           - The anesthesia plan was to use monitored                            anesthesia care (MAC).                           After obtaining informed consent, the endoscope was                            passed under direct vision. Throughout the                            procedure, the patient's blood pressure, pulse, and                            oxygen saturations were monitored continuously. The                            GIF-H190 ([id number]) scope was introduced through the                            mouth, and advanced to the second part of duodenum.                            The upper GI endoscopy was accomplished without                            difficulty. The patient tolerated the procedure                            well. Scope In: 8:34:19 AM Scope  Out: 8:40:58 AM Total Procedure Duration: 0 hours 6 minutes 39 seconds  Findings:      Mucosal changes including ringed esophagus, feline appearance and       longitudinal furrows were found in the middle third of the esophagus and       in the lower third of the esophagus. Esophageal findings were graded       using the Eosinophilic Esophagitis Endoscopic Reference Score        (EoE-EREFS) as: Edema Grade 1 Present (decreased clarity or absence of       vascular markings), Rings Grade 1 Mild (subtle circumferential ridges       seen on esophageal distension), Exudates Grade 1 Mild (scattered white       lesions involving less than 10 percent of the esophageal surface area),       Furrows Grade 1 Present (vertical lines with or without visible depth)       and Stricture present. Biopsies were taken with a cold forceps for       histology. A TTS dilator was passed through the scope. Dilation with an       18-19-20 mm balloon dilator was performed to 20 mm. The dilation site       was examined and showed mild mucosal disruption and moderate improvement       in luminal narrowing.      Localized mild inflammation characterized by erythema and linear       erosions was found in the gastric antrum. Biopsies were taken with a       cold forceps for Helicobacter pylori testing.      The duodenal bulb, first portion of the duodenum and second portion of       the duodenum were normal. Impression:               - Esophageal mucosal changes suggestive of                            eosinophilic esophagitis. Biopsied. Dilated.                           - Gastritis. Biopsied.                           - Normal duodenal bulb, first portion of the                            duodenum and second portion of the duodenum. Moderate Sedation:      Per Anesthesia Care Recommendation:           - Patient has a contact number available for                            emergencies. The signs and symptoms of potential                            delayed complications were discussed with the                            patient. Return to normal activities tomorrow.  Written discharge instructions were provided to the                            patient.                           - Resume previous diet.                           - Continue present medications.                            - Await pathology results.                           - Repeat upper endoscopy at appointment to be                            scheduled to evaluate the response to therapy.                           - Return to GI clinic in 3 months.                           - Use Protonix (pantoprazole) 40 mg PO BID. Procedure Code(s):        --- Professional ---                           [phone], Esophagogastroduodenoscopy, flexible,                            transoral; with transendoscopic balloon dilation of                            esophagus (less than 30 mm diameter)                           43239, 59, Esophagogastroduodenoscopy, flexible,                            transoral; with biopsy, single or multiple Diagnosis Code(s):        --- Professional ---                           K22.8, Other specified diseases of esophagus                           K29.70, Gastritis, unspecified, without bleeding                           R13.10, Dysphagia, unspecified CPT copyright 2019 American Medical Association. All rights reserved. The codes documented in this report are preliminary and upon coder review may  be revised to meet current compliance requirements. Elon Alas. Abbey Chatters, DO Ramirez-Perez Abbey Chatters, DO 08/06/2021 8:46:16 AM This report has been signed electronically. Number of Addenda: 0

## 2021-08-08 LAB — SURGICAL PATHOLOGY

## 2021-08-13 ENCOUNTER — Encounter (HOSPITAL_COMMUNITY): Payer: Self-pay | Admitting: Internal Medicine

## 2021-08-21 ENCOUNTER — Telehealth: Payer: Self-pay

## 2021-08-21 NOTE — Telephone Encounter (Signed)
Pt Alexa Wright on my vm and I returned her call she is concerned about her bowel movement being yellow to gold in color. This didn't start until she started taking Pantoprazole a week and a half ago. She states that her stools are somewhat loose, but what's bothering her is the color and she wants to know if it is normal? Please advise

## 2021-08-22 ENCOUNTER — Telehealth: Payer: Self-pay | Admitting: Internal Medicine

## 2021-08-22 NOTE — Telephone Encounter (Signed)
Phoned and advised the pt that Dr Marletta Lor has not got with me on her note from yesterday but when he does I would contact her. Pt expressed understanding.

## 2021-08-22 NOTE — Telephone Encounter (Signed)
Patient called back about yesterday wanting to know if anything was decided.

## 2021-08-23 NOTE — Telephone Encounter (Signed)
noted 

## 2021-08-23 NOTE — Telephone Encounter (Signed)
I called and spoke with patient about her concerns. She has made drastic dietary changes due to her EOE which is likely the cause of the change in her BMs. She is concerned about her liver. I offered lab work to check her liver numbers and she would like to wait and see. If her symptoms continue we will check basic blood work with CMP.

## 2021-10-22 ENCOUNTER — Encounter: Payer: Self-pay | Admitting: Internal Medicine

## 2021-12-19 NOTE — Progress Notes (Signed)
CARDIOLOGY CONSULT NOTE       Patient ID: Alexa Wright MRN: [id number] DOB/AGE: 1966-03-17 56 y.o.  Admit date: (Not on file) Referring Physician: Reuel Boom Primary Physician: Richardean Chimera, MD Primary Cardiologist: New Reason for Consultation: Dyspnea   HPI:  56 y.o. referred by [facility] Medicine Dr Reuel Boom for dyspnea. Office visit 11/25/21 indicated acute intermittent rapid palpitations at night woke from sleep with dyspnea. Lasting minutes improved laying on left side Occurred 3 nights in a row. Has asthma and more wheezing Using Albuterol inhaler but this also increases her HR History of SVT Last seen by Dr Kristen Loader in 2019 Myovue normal 11/30/17 EF 65% Has not worked since 2011 ? On disability for bipolar dx and fibromyalgia She is overweight and eats too much salt/carbs Chronic LE edema from varicose veins  No documentation of SVT in Epic or old cardiology records  Biggest issue is anxiety over mothers health She is 77 with history of esophageal cancer and may have recurrence with abdominal / back pain elevated calcium and LFTls Patient crying about situation in office today   ROS All other systems reviewed and negative except as noted above  Past Medical History:  Diagnosis Date   Asthma    Bradycardia    Degenerative disc disease    Depression    Fibromyalgia    Gastroesophageal reflux disease    Hypertension    Migraine    Obesity    PAC (premature atrial contraction)     Family History  Problem Relation Age of Onset   Esophageal cancer Mother        Living   Heart attack Father        Living   Mental retardation Sister    Migraines Sister    Migraines Sister    Migraines Daughter    Colon cancer Neg Hx    Gastric cancer Neg Hx     Social History   Socioeconomic History   Marital status: Married    Spouse name: Not on file   Number of children: Not on file   Years of education: Not on file   Highest education level: Not on file  Occupational  History   Not on file  Tobacco Use   Smoking status: Former    Types: Cigarettes    Quit date: 01/31/1992    Years since quitting: 29.9   Smokeless tobacco: Never  Vaping Use   Vaping Use: Never used  Substance and Sexual Activity   Alcohol use: No    Alcohol/week: 0.0 standard drinks   Drug use: No   Sexual activity: Not on file  Other Topics Concern   Not on file  Social History Narrative   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  She is on disability for ?bipolar disorder and fibromyalgia in January 2012.   She lives at home with her husband.   Highest level of education:  Associated degree.    Social Determinants of Health   Financial Resource Strain: Not on file  Food Insecurity: Not on file  Transportation Needs: Not on file  Physical Activity: Not on file  Stress: Not on file  Social Connections: Not on file  Intimate Partner Violence: Not on file    Past Surgical History:  Procedure Laterality Date   ACHILLES TENDON REPAIR Left    BALLOON DILATION N/A 08/06/2021   Procedure: BALLOON DILATION;  Surgeon: Lanelle Bal, DO;  [facility];  Service: Endoscopy;  Laterality: N/A;   BIOPSY  08/06/2021   Procedure: BIOPSY;  Surgeon: Lanelle Bal, DO;  Location: [facility];  Service: Endoscopy;;   ESOPHAGOGASTRODUODENOSCOPY (EGD) WITH PROPOFOL N/A 08/06/2021   Procedure: ESOPHAGOGASTRODUODENOSCOPY (EGD) WITH PROPOFOL;  Surgeon: Lanelle Bal, DO;  Location: [facility];  Service: Endoscopy;  Laterality: N/A;  8:15am   GALLBLADDER SURGERY        Current Outpatient Medications:    albuterol (VENTOLIN HFA) 108 (90 Base) MCG/ACT inhaler, Inhale 2 puffs into the lungs every 6 (six) hours as needed for wheezing or shortness of breath., Disp: , Rfl:    ALPRAZolam (XANAX) 0.5 MG tablet, Take 0.5 mg by mouth 3 (three) times daily as needed for sleep. For sleep or anxiety, Disp: , Rfl:    esomeprazole (NEXIUM) 40 MG capsule,  Take 40 mg by mouth 2 (two) times daily., Disp: , Rfl:    loratadine (CLARITIN) 10 MG tablet, Take 5 mg by mouth daily., Disp: , Rfl:    rosuvastatin (CRESTOR) 5 MG tablet, Take 5 mg by mouth daily., Disp: , Rfl:     Physical Exam: Blood pressure (!) 132/96, pulse 86, height 5\' 5"  (1.651 m), weight 235 lb 1.6 oz (106.6 kg), last menstrual period 03/10/2019, SpO2 98 %.    Affect appropriate Healthy:  appears stated age HEENT: normal Neck supple with no adenopathy JVP normal no bruits no thyromegaly Lungs clear with no wheezing and good diaphragmatic motion Heart:  S1/S2 no murmur, no rub, gallop or click PMI normal Abdomen: benighn, BS positve, no tenderness, no AAA no bruit.  No HSM or HJR Distal pulses intact with no bruits No edema Neuro non-focal Skin warm and dry No muscular weakness  Labs:   Lab Results  Component Value Date   WBC 6.8 05/15/2012   HGB 13.7 05/15/2012   HCT 40.1 05/15/2012   MCV 86.8 05/15/2012   PLT 237 05/15/2012   No results for input(s): NA, K, CL, CO2, BUN, CREATININE, CALCIUM, PROT, BILITOT, ALKPHOS, ALT, AST, GLUCOSE in the last 168 hours.  Invalid input(s): LABALBU No results found for: CKTOTAL, CKMB, CKMBINDEX, TROPONINI No results found for: CHOL No results found for: HDL No results found for: LDLCALC No results found for: TRIG No results found for: CHOLHDL No results found for: LDLDIRECT    Radiology: No results found.  EKG: SR rate 100 normal 11/25/21    ASSESSMENT AND PLAN:   Palpitations benign sounding ? History of SVT 14 day monitor  Dyspnea:  benign sounding normal exam TTE  Asthma:  see above has ventolin no active wheezing  Anxiety/Depression:  major issue has xanax but may benefit from SSRI I suspect mothers health will continue to deteriorate   Echo 14 day monitor    F/U PRN if above normal   Signed: 11/27/21 12/25/2021, 10:12 AM

## 2021-12-25 ENCOUNTER — Other Ambulatory Visit: Payer: Self-pay

## 2021-12-25 ENCOUNTER — Ambulatory Visit: Payer: Medicare HMO | Admitting: Cardiovascular Disease

## 2021-12-25 ENCOUNTER — Ambulatory Visit (INDEPENDENT_AMBULATORY_CARE_PROVIDER_SITE_OTHER): Payer: Medicare HMO

## 2021-12-25 ENCOUNTER — Encounter: Payer: Self-pay | Admitting: Cardiovascular Disease

## 2021-12-25 VITALS — BP 132/96 | HR 86 | Ht 65.0 in | Wt 235.1 lb

## 2021-12-25 DIAGNOSIS — F411 Generalized anxiety disorder: Secondary | ICD-10-CM

## 2021-12-25 DIAGNOSIS — R002 Palpitations: Secondary | ICD-10-CM

## 2021-12-25 DIAGNOSIS — R0609 Other forms of dyspnea: Secondary | ICD-10-CM | POA: Diagnosis not present

## 2021-12-25 DIAGNOSIS — J4520 Mild intermittent asthma, uncomplicated: Secondary | ICD-10-CM | POA: Diagnosis not present

## 2021-12-25 NOTE — Patient Instructions (Signed)
Medication Instructions:  Your physician recommends that you continue on your current medications as directed. Please refer to the Current Medication list given to you today.  *If you need a refill on your cardiac medications before your next appointment, please call your pharmacy*   Lab Work: NONE   If you have labs (blood work) drawn today and your tests are completely normal, you will receive your results only by: MyChart Message (if you have MyChart) OR A paper copy in the mail If you have any lab test that is abnormal or we need to change your treatment, we will call you to review the results.   Testing/Procedures: Your physician has requested that you have an echocardiogram. Echocardiography is a painless test that uses sound waves to create images of your heart. It provides your doctor with information about the size and shape of your heart and how well your heart's chambers and valves are working. This procedure takes approximately one hour. There are no restrictions for this procedure.  ZIO XT- Long Term Monitor Instructions   Your physician has requested you wear your ZIO patch monitor___14___days.   This is a single patch monitor.  [employer] supplies one patch monitor per enrollment.  Additional stickers are not available.   Please do not apply patch if you will be having a Nuclear Stress Test, Echocardiogram, Cardiac CT, MRI, or Chest Xray during the time frame you would be wearing the monitor. The patch cannot be worn during these tests.  You cannot remove and re-apply the ZIO XT patch monitor.   Your ZIO patch monitor will be sent USPS Priority mail from [employer] directly to your home address. The monitor may also be mailed to a PO BOX if home delivery is not available.   It may take 3-5 days to receive your monitor after you have been enrolled.   Once you have received you monitor, please review enclosed instructions.  Your monitor has already been registered  assigning a specific monitor serial # to you.   Applying the monitor   Shave hair from upper left chest.   Hold abrader disc by orange tab.  Rub abrader in 40 strokes over left upper chest as indicated in your monitor instructions.   Clean area with 4 enclosed alcohol pads .  Use all pads to assure are is cleaned thoroughly.  Let dry.   Apply patch as indicated in monitor instructions.  Patch will be place under collarbone on left side of chest with arrow pointing upward.   Rub patch adhesive wings for 2 minutes.Remove white label marked "1".  Remove white label marked "2".  Rub patch adhesive wings for 2 additional minutes.   While looking in a mirror, press and release button in center of patch.  A small green light will flash 3-4 times .  This will be your only indicator the monitor has been turned on.     Do not shower for the first 24 hours.  You may shower after the first 24 hours.   Press button if you feel a symptom. You will hear a small click.  Record Date, Time and Symptom in the Patient Log Book.   When you are ready to remove patch, follow instructions on last 2 pages of Patient Log Book.  Stick patch monitor onto last page of Patient Log Book.   Place Patient Log Book in Blue box.  Use locking tab on box and tape box closed securely.  The Orange and White box   has prepaid postage on it.  Please place in mailbox as soon as possible.  Your physician should have your test results approximately 7 days after the monitor has been mailed back to [employer].   Call [employer] Customer Care at [phone] if you have questions regarding your ZIO XT patch monitor.  Call them immediately if you see an orange light blinking on your monitor.   If your monitor falls off in less than 4 days contact our Monitor department at [phone].  If your monitor becomes loose or falls off after 4 days call [employer] at [phone] for suggestions on securing your monitor.      Follow-Up: At [facility], you and your health needs are our priority.  As part of our continuing mission to provide you with exceptional heart care, we have created designated Provider Care Teams.  These Care Teams include your primary Cardiologist (physician) and Advanced Practice Providers (APPs -  Physician Assistants and Nurse Practitioners) who all work together to provide you with the care you need, when you need it.  We recommend signing up for the patient portal called "MyChart".  Sign up information is provided on this After Visit Summary.  MyChart is used to connect with patients for Virtual Visits (Telemedicine).  Patients are able to view lab/test results, encounter notes, upcoming appointments, etc.  Non-urgent messages can be sent to your provider as well.   To learn more about what you can do with MyChart, go to [email or web address].    Your next appointment:    As Needed   The format for your next appointment:   In Person  Provider:   Peter Nishan, MD   Other Instructions Thank you for choosing [facility]!    

## 2022-01-01 ENCOUNTER — Other Ambulatory Visit (HOSPITAL_COMMUNITY)
Admission: RE | Admit: 2022-01-01 | Discharge: 2022-01-01 | Disposition: A | Discharge status: Home / Self Care | Payer: Medicare HMO | Source: Ambulatory Visit | Attending: Internal Medicine | Admitting: Internal Medicine

## 2022-01-01 ENCOUNTER — Encounter: Payer: Self-pay | Admitting: *Deleted

## 2022-01-01 ENCOUNTER — Other Ambulatory Visit: Payer: Self-pay

## 2022-01-01 ENCOUNTER — Ambulatory Visit: Payer: Medicare HMO | Admitting: Internal Medicine

## 2022-01-01 VITALS — BP 122/70 | HR 77 | Temp 97.3°F | Ht 65.0 in | Wt 234.6 lb

## 2022-01-01 DIAGNOSIS — E669 Obesity, unspecified: Secondary | ICD-10-CM | POA: Insufficient documentation

## 2022-01-01 DIAGNOSIS — R7989 Other specified abnormal findings of blood chemistry: Secondary | ICD-10-CM

## 2022-01-01 DIAGNOSIS — Z6839 Body mass index (BMI) 39.0-39.9, adult: Secondary | ICD-10-CM | POA: Diagnosis not present

## 2022-01-01 DIAGNOSIS — R109 Unspecified abdominal pain: Secondary | ICD-10-CM | POA: Diagnosis not present

## 2022-01-01 DIAGNOSIS — K200 Eosinophilic esophagitis: Secondary | ICD-10-CM | POA: Insufficient documentation

## 2022-01-01 LAB — HEPATIC FUNCTION PANEL
ALT: 17 U/L (ref 0–44)
AST: 16 U/L (ref 15–41)
Albumin: 4.4 g/dL (ref 3.5–5.0)
Alkaline Phosphatase: 97 U/L (ref 38–126)
Bilirubin, Direct: 0.2 mg/dL (ref 0.0–0.2)
Indirect Bilirubin: 1 mg/dL — ABNORMAL HIGH (ref 0.3–0.9)
Total Bilirubin: 1.2 mg/dL (ref 0.3–1.2)
Total Protein: 8 g/dL (ref 6.5–8.1)

## 2022-01-01 LAB — IRON AND TIBC
Iron: 66 ug/dL (ref 28–170)
Saturation Ratios: 14 % (ref 10.4–31.8)
TIBC: 465 ug/dL — ABNORMAL HIGH (ref 250–450)
UIBC: 399 ug/dL

## 2022-01-01 LAB — FERRITIN: Ferritin: 74 ng/mL (ref 11–307)

## 2022-01-01 NOTE — Patient Instructions (Signed)
Happy to hear that your swallowing is improved.  Continue on Nexium twice daily.  Try to adhere to 6 food illumination diet as best as you can. ? ?We will need to repeat EGD in the future with repeat esophageal biopsies.  We will hold off for now. ? ?For your abnormal liver function tests, I am going to perform full serological work-up today at [facility] lab. ? ?I am also going to order an ultrasound with elastography to evaluate your liver to be performed at [facility]. ? ?We will call you with these results. ? ?Follow-up with GI in 3 to 4 months. ? ?It was very nice seeing you again today. ? ?Dr. Marletta Lor ? ?Nonalcoholic Fatty Liver Disease Diet, Adult ?Nonalcoholic fatty liver disease is a condition that causes fat to build up in and around the liver. The disease makes it harder for the liver to work the way that it should. Following a healthy diet can help to keep nonalcoholic fatty liver disease under control. It can also help to prevent or improve conditions that are associated with the disease, such as heart disease, diabetes, high blood pressure, and abnormal cholesterol levels. ?Along with regular exercise, this diet: ?Promotes weight loss. ?Helps to control blood sugar levels. ?Helps to improve the way that the body uses insulin. ?What are tips for following this plan? ?Reading food labels ? ?Always check food labels for: ?The amount of saturated fat in a food. You should limit your intake of saturated fat. Saturated fat is found in foods that come from animals, including meat and dairy products such as butter, cheese, and whole milk. ?The amount of fiber in a food. You should choose high-fiber foods such as fruits, vegetables, and whole grains. Try to get 25-30 grams (g) of fiber a day. ?  ?Cooking ?When cooking, use heart-healthy oils that are high in monounsaturated fats. These include olive oil, canola oil, and avocado oil. ?Limit frying or deep-frying foods. Cook foods using healthy methods such as  baking, boiling, steaming, and grilling instead. ?Meal planning ?You may want to keep track of how many calories you take in. Eating the right amount of calories will help you achieve a healthy weight. Meeting with a registered dietitian can help you get started. ?Limit how often you eat takeout and fast food. These foods are usually very high in fat, salt, and sugar. ?Use the glycemic index (GI) to plan your meals. The index tells you how quickly a food will raise your blood sugar. Choose low-GI foods (GI less than 55). These foods take a longer time to raise blood sugar. A registered dietitian can help you identify foods lower on the GI scale. ?Lifestyle ?You may want to follow a Mediterranean diet. This diet includes a lot of vegetables, lean meats or fish, whole grains, fruits, and healthy oils and fats. ?What foods can I eat? ? ?  ?Fruits ?Bananas. Apples. Oranges. Grapes. Papaya. Mango. Pomegranate. Kiwi. Grapefruit. Cherries. ?Vegetables ?Lettuce. Spinach. Peas. Beets. Cauliflower. Cabbage. Broccoli. Carrots. Tomatoes. Squash. Eggplant. Herbs. Peppers. Onions. Cucumbers. Brussels sprouts. Yams and sweet potatoes. Beans. Lentils. ?Grains ?Whole wheat or whole-grain foods, including breads, crackers, cereals, and pasta. Stone-ground whole wheat. Unsweetened oatmeal. Bulgur. Barley. Quinoa. Brown or wild rice. Corn or whole wheat flour tortillas. ?Meats and other proteins ?Lean meats. Poultry. Tofu. Seafood and shellfish. ?Dairy ?Low-fat or fat-free dairy products, such as yogurt, cottage cheese, or cheese. ?Beverages ?Water. Sugar-free drinks. Tea. Coffee. Low-fat or skim milk. Milk alternatives, such as soy  or almond milk. Real fruit juice. ?Fats and oils ?Avocado. Canola or olive oil. Nuts and nut butters. Seeds. ?Seasonings and condiments ?Mustard. Relish. Low-fat, low-sugar ketchup and barbecue sauce. Low-fat or fat-free mayonnaise. ?Sweets and desserts ?Sugar-free sweets. ?The items listed above may not be  a complete list of foods and beverages you can eat. Contact a dietitian for more information. ?What foods should I limit or avoid? ?Meats and other proteins ?Limit red meat to 1-2 times a week. ?Dairy ?Full-fat dairy. ?Fats and oils ?Palm oil and coconut oil. Fried foods. ?Other foods ?Processed foods. Foods that contain a lot of salt or sodium. ?Sweets and desserts ?Sweets that contain sugar. ?Beverages ?Sweetened drinks, such as sweet tea, milkshakes, iced sweet drinks, and sodas. Alcohol. ?The items listed above may not be a complete list of foods and beverages you should avoid. Contact a dietitian for more information. ?Where to find more information ?The [employer] of Diabetes and Digestive and Kidney Diseases: [email or web address].si ?Summary ?Nonalcoholic fatty liver disease is a condition that causes fat to build up in and around the liver. ?Following a healthy diet can help to keep nonalcoholic fatty liver disease under control. Your diet should be rich in fruits, vegetables, whole grains, and lean proteins. ?Limit your intake of saturated fat. Saturated fat is found in foods that come from animals, including meat and dairy products such as butter, cheese, and whole milk. ?This diet promotes weight loss, helps to control blood sugar levels, and helps to improve the way that the body uses insulin. ?This information is not intended to replace advice given to you by your health care provider. Make sure you discuss any questions you have with your health care provider. ?Document Revised: 02/11/2019 Document Reviewed: 11/11/2018 ?Elsevier Patient Education ? 2020 Elsevier Inc. ? ?At [facility] we value your feedback. You may receive a survey about your visit today. Please share your experience as we strive to create trusting relationships with our patients to provide genuine, compassionate, quality care. ? ?We appreciate your understanding and patience as we review any laboratory studies, imaging,  and other diagnostic tests that are ordered as we care for you. Our office policy is 5 business days for review of these results, and any emergent or urgent results are addressed in a timely manner for your best interest. If you do not hear from our office in 1 week, please contact us.  ? ?We also encourage the use of MyChart, which contains your medical information for your review as well. If you are not enrolled in this feature, an access code is on this after visit summary for your convenience. Thank you for allowing [place] to be involved in your care. ? ?It was great to see you today!  I hope you have a great rest of your Winter! ? ? ? ?Hennie Duos. Marletta Lor, D.O. ?Gastroenterology and Hepatology ?[facility] ? ?

## 2022-01-01 NOTE — Progress Notes (Signed)
? ? ?Referring Provider: Caryl Bis, MD ?Primary Care Physician:  Caryl Bis, MD ?Primary GI:  Dr. Abbey Chatters ? ?Chief Complaint  ?Patient presents with  ? Follow-up  ? ? ?HPI:   ?Alexa Wright is a 56 y.o. female who presents to clinic today for follow-up visit.  Previously seen for chronic GERD and dysphagia.  Underwent upper endoscopy 08/06/2021 which showed esophageal findings consistent with eosinophilic esophagitis.  Esophageal biopsies showed squamous mucosa with focally increased intraepithelial eosinophils greater than 20 per high-power field.  I started her on pantoprazole 40 mg twice daily.  Also performed esophageal dilation.  Patient was recently switched to esomeprazole 40 mg twice daily. ? ?States her swallowing is vastly improved.  Reflux well controlled.  States she is attempted to adhere to a 6 food illumination diet though has been difficult.  Especially dairy. ? ?Patient also states that she recently had blood work performed by her PCP which showed abnormal liver function test.  Notes family history of liver disease and at least 3-4 members, 1 of which required liver transplant.  Another with cirrhosis.  Felt to be due to fatty liver disease. ? ?No previous or current alcohol use.  No history of illicit drug use.  Risk factors for fatty liver disease include obesity and dyslipidemia.  No diabetes. ? ?Patient underwent colonoscopy by Dr. Rocco Serene 03/22/2020 with 1 polyp removed and recommended 5-year recall. ? ?Past Medical History:  ?Diagnosis Date  ? Asthma   ? Bradycardia   ? Degenerative disc disease   ? Depression   ? Fibromyalgia   ? Gastroesophageal reflux disease   ? Hypertension   ? Migraine   ? Obesity   ? PAC (premature atrial contraction)   ? ? ?Past Surgical History:  ?Procedure Laterality Date  ? ACHILLES TENDON REPAIR Left   ? BALLOON DILATION N/A 08/06/2021  ? Procedure: BALLOON DILATION;  Surgeon: Eloise Harman, DO;  Location: [facility];  Service: Endoscopy;   Laterality: N/A;  ? BIOPSY  08/06/2021  ? Procedure: BIOPSY;  Surgeon: Eloise Harman, DO;  Location: [facility];  Service: Endoscopy;;  ? ESOPHAGOGASTRODUODENOSCOPY (EGD) WITH PROPOFOL N/A 08/06/2021  ? Procedure: ESOPHAGOGASTRODUODENOSCOPY (EGD) WITH PROPOFOL;  Surgeon: Eloise Harman, DO;  Location: [facility];  Service: Endoscopy;  Laterality: N/A;  8:15am  ? GALLBLADDER SURGERY    ? ? ?Current Outpatient Medications  ?Medication Sig Dispense Refill  ? albuterol (VENTOLIN HFA) 108 (90 Base) MCG/ACT inhaler Inhale 2 puffs into the lungs every 6 (six) hours as needed for wheezing or shortness of breath.    ? ALPRAZolam (XANAX) 0.5 MG tablet Take 0.5 mg by mouth 3 (three) times daily as needed for sleep. For sleep or anxiety    ? esomeprazole (NEXIUM) 40 MG capsule Take 40 mg by mouth 2 (two) times daily.    ? loratadine (CLARITIN) 10 MG tablet Take 5 mg by mouth daily.    ? rosuvastatin (CRESTOR) 5 MG tablet Take 5 mg by mouth daily.    ? ?No current facility-administered medications for this visit.  ? ? ?Allergies as of 01/01/2022 - Review Complete 01/01/2022  ?Allergen Reaction Noted  ? Bee pollen Swelling 10/31/2014  ? Dilaudid [hydromorphone hcl] Itching 05/15/2012  ? Fentanyl Other (See Comments) 07/31/2021  ? Hydrocodone Other (See Comments) 05/15/2012  ? Lisinopril Cough 03/08/2015  ? Morphine and related  03/04/2013  ? Paroxetine hcl Other (See Comments) 08/26/2011  ? Duloxetine Nausea Only 08/26/2011  ?  Etodolac Nausea And Vomiting 08/26/2011  ? Latex Rash 03/04/2013  ? Milnacipran  02/19/2011  ? ? ?Family History  ?Problem Relation Age of Onset  ? Esophageal cancer Mother   ?     Living  ? Heart attack Father   ?     Living  ? Mental retardation Sister   ? Migraines Sister   ? Migraines Sister   ? Migraines Daughter   ? Colon cancer Neg Hx   ? Gastric cancer Neg Hx   ? ? ?Social History  ? ?Socioeconomic History  ? Marital status: Married  ?  Spouse name: Not on file  ? Number of children: Not  on file  ? Years of education: Not on file  ? Highest education level: Not on file  ?Occupational History  ? Not on file  ?Tobacco Use  ? Smoking status: Former  ?  Types: Cigarettes  ?  Quit date: 01/31/1992  ?  Years since quitting: 29.9  ? Smokeless tobacco: Never  ?Vaping Use  ? Vaping Use: Never used  ?Substance and Sexual Activity  ? Alcohol use: No  ?  Alcohol/week: 0.0 standard drinks  ? Drug use: No  ? Sexual activity: Not on file  ?Other Topics Concern  ? Not on file  ?Social History Narrative  ? 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  She is on disability for ?bipolar disorder and fibromyalgia in January 2012.  ? She lives at home with her husband.  ? Highest level of education:  Associated degree.   ? ?Social Determinants of Health  ? ?Financial Resource Strain: Not on file  ?Food Insecurity: Not on file  ?Transportation Needs: Not on file  ?Physical Activity: Not on file  ?Stress: Not on file  ?Social Connections: Not on file  ? ? ?Subjective: ?Review of Systems  ?Constitutional:  Negative for chills and fever.  ?HENT:  Negative for congestion and hearing loss.   ?Eyes:  Negative for blurred vision and double vision.  ?Respiratory:  Negative for cough and shortness of breath.   ?Cardiovascular:  Negative for chest pain and palpitations.  ?Gastrointestinal:  Positive for abdominal pain and heartburn. Negative for blood in stool, constipation, diarrhea, melena and vomiting.  ?Genitourinary:  Negative for dysuria and urgency.  ?Musculoskeletal:  Negative for joint pain and myalgias.  ?Skin:  Negative for itching and rash.  ?Neurological:  Negative for dizziness and headaches.  ?Psychiatric/Behavioral:  Negative for depression. The patient is not nervous/anxious.   ? ? ?Objective: ?BP 122/70   Pulse 77   Temp (!) 97.3 ?F (36.3 ?C)   Ht 5\' 5"  (1.651 m)   Wt 234 lb 9.6 oz (106.4 kg)   LMP 03/10/2019 (Approximate)   BMI 39.04 kg/m?  ?Physical Exam ?Constitutional:   ?    Appearance: Normal appearance. She is obese.  ?HENT:  ?   Head: Normocephalic and atraumatic.  ?Eyes:  ?   Extraocular Movements: Extraocular movements intact.  ?   Conjunctiva/sclera: Conjunctivae normal.  ?Cardiovascular:  ?   Rate and Rhythm: Normal rate and regular rhythm.  ?Pulmonary:  ?   Effort: Pulmonary effort is normal.  ?   Breath sounds: Normal breath sounds.  ?Abdominal:  ?   General: Bowel sounds are normal.  ?   Palpations: Abdomen is soft.  ?Musculoskeletal:     ?   General: No swelling. Normal range of motion.  ?   Cervical back: Normal range of motion  and neck supple.  ?Skin: ?   General: Skin is warm and dry.  ?   Coloration: Skin is not jaundiced.  ?Neurological:  ?   General: No focal deficit present.  ?   Mental Status: She is alert and oriented to person, place, and time.  ?Psychiatric:     ?   Mood and Affect: Mood normal.     ?   Behavior: Behavior normal.  ? ? ? ?Assessment: ?*Eosinophilic esophagitis ?*Abnormal liver function test ?*Obesity ? ?Plan: ?Discussed eosinophilic esophagitis in depth with patient today.  Patient's symptoms are much improved status post esophageal dilation and twice daily PPI.  We will continue on Esomeprazole 40 mg BID. ? ?We will tentatively plan a repeat EGD with esophageal biopsies in 3 to 6 months. ? ?Abnormal liver function test likely due to nonalcoholic fatty liver disease.  Discussed this in depth with her today. ? ?We will perform full serological work-up to rule out other potential causes. ? ?We will also order ultrasound with elastography. ? ?Recommend 1-2# weight loss per week until ideal body weight through exercise & diet. ?Low fat/cholesterol diet.   ?Avoid sweets, sodas, fruit juices, sweetened beverages like tea, etc. ?Gradually increase exercise from 15 min daily up to 1 hr per day 5 days/week. ?Limit alcohol use. ? ?Follow up in 4 months.  ? ?01/01/2022 11:31 AM ? ? ?Disclaimer: This note was dictated with voice recognition software. Similar  sounding words can inadvertently be transcribed and may not be corrected upon review. ? ?

## 2022-01-02 LAB — HEPATITIS PANEL, ACUTE
HCV Ab: NONREACTIVE
Hep A IgM: NONREACTIVE
Hep B C IgM: NONREACTIVE
Hepatitis B Surface Ag: NONREACTIVE

## 2022-01-03 LAB — IGG: IgG (Immunoglobin G), Serum: 920 mg/dL (ref 586–1602)

## 2022-01-03 LAB — IGM: IgM (Immunoglobulin M), Srm: 117 mg/dL (ref 26–217)

## 2022-01-03 LAB — IGA: IgA: 238 mg/dL (ref 87–352)

## 2022-01-07 LAB — TISSUE TRANSGLUTAMINASE, IGG: Tissue Transglut Ab: <2 U/mL (ref 0–5)

## 2022-01-07 LAB — ANA: Anti Nuclear Antibody (ANA): NEGATIVE

## 2022-01-07 LAB — TISSUE TRANSGLUTAMINASE, IGA: Tissue Transglutaminase Ab, IgA: <2 U/mL (ref 0–3)

## 2022-01-07 LAB — ANTI-SMOOTH MUSCLE ANTIBODY, IGG: F-Actin IgG: 10 Units (ref 0–19)

## 2022-01-07 LAB — MITOCHONDRIAL ANTIBODIES: Mitochondrial M2 Ab, IgG: <20 Units (ref 0.0–20.0)

## 2022-01-09 ENCOUNTER — Other Ambulatory Visit: Payer: Self-pay

## 2022-01-09 ENCOUNTER — Ambulatory Visit (HOSPITAL_COMMUNITY)
Admission: RE | Admit: 2022-01-09 | Discharge: 2022-01-09 | Disposition: A | Discharge status: Home / Self Care | Payer: Medicare HMO | Source: Ambulatory Visit | Attending: Internal Medicine | Admitting: Internal Medicine

## 2022-01-09 DIAGNOSIS — R0609 Other forms of dyspnea: Secondary | ICD-10-CM | POA: Insufficient documentation

## 2022-01-09 DIAGNOSIS — R7989 Other specified abnormal findings of blood chemistry: Secondary | ICD-10-CM

## 2022-01-10 ENCOUNTER — Ambulatory Visit (HOSPITAL_BASED_OUTPATIENT_CLINIC_OR_DEPARTMENT_OTHER)
Admission: RE | Admit: 2022-01-10 | Discharge: 2022-01-10 | Disposition: A | Discharge status: Home / Self Care | Payer: Medicare HMO | Source: Ambulatory Visit | Attending: Cardiovascular Disease | Admitting: Cardiovascular Disease

## 2022-01-10 DIAGNOSIS — R0609 Other forms of dyspnea: Secondary | ICD-10-CM | POA: Diagnosis not present

## 2022-01-10 LAB — HEPATITIS B SURFACE ANTIBODY,QUALITATIVE

## 2022-01-10 LAB — ECHOCARDIOGRAM COMPLETE
Area-P 1/2: 3.91 cm2
S' Lateral: 2.7 cm

## 2022-01-10 NOTE — Progress Notes (Signed)
*  PRELIMINARY RESULTS* ?Echocardiogram ?2D Echocardiogram has been performed. ? ?Stacey Drain ?01/10/2022, 1:41 PM ?

## 2022-01-11 ENCOUNTER — Telehealth: Payer: Self-pay | Admitting: Internal Medicine

## 2022-01-11 NOTE — Telephone Encounter (Signed)
Please let patient know blood work looks good.  Her liver function tests have normalized.  Full serological work-up unremarkable.  Hepatitis B surface antibodies positive due to previous vaccination. ? ?Ultrasound showed fatty liver.  No chronic scarring identified on elastography.  Continue to work on diet and weight loss. ? ?Follow-up with GI as previously scheduled.  Thank you ?

## 2022-01-13 NOTE — Telephone Encounter (Signed)
Returned the pt's call and advised of her result note and labs. Pt also made aware of her U/S results. Advised of recommendations for weight loss and exercise. Advised to follow up as previously scheduled. Pt expressed understanding. ?

## 2022-01-24 ENCOUNTER — Telehealth: Payer: Self-pay | Admitting: Cardiovascular Disease

## 2022-01-24 NOTE — Telephone Encounter (Signed)
Patient called for her results.  °

## 2022-01-24 NOTE — Telephone Encounter (Signed)
Patient notified via detailed voice message -  ? ?Alexa Pock Pinnix, LPN  ?02/09/1447 12:11 PM EDT   ?  ?Called patient with test results. No answer. Left message to call back.  ? Wendall Stade, MD  ?01/23/2022 11:30 AM EDT   ?  ?No significant arrhythmia or sustained SVT  ? ?

## 2022-01-27 ENCOUNTER — Telehealth: Payer: Self-pay

## 2022-01-27 NOTE — Telephone Encounter (Signed)
Patient notified and verbalized understanding. Pt had no questions or concerns at this time 

## 2022-01-27 NOTE — Telephone Encounter (Signed)
-----   Message from Josue Hector, MD sent at 01/23/2022 11:30 AM EDT ----- ?No significant arrhythmia or sustained SVT ?

## 2022-03-20 ENCOUNTER — Encounter: Payer: Self-pay | Admitting: Internal Medicine

## 2022-03-27 ENCOUNTER — Encounter: Payer: Self-pay | Admitting: Nutrition

## 2022-03-27 ENCOUNTER — Encounter: Payer: Medicare HMO | Attending: Family Medicine | Admitting: Nutrition

## 2022-03-27 VITALS — Ht 65.0 in | Wt 242.0 lb

## 2022-03-27 DIAGNOSIS — I10 Essential (primary) hypertension: Secondary | ICD-10-CM | POA: Insufficient documentation

## 2022-03-27 DIAGNOSIS — K760 Fatty (change of) liver, not elsewhere classified: Secondary | ICD-10-CM | POA: Insufficient documentation

## 2022-03-27 DIAGNOSIS — E782 Mixed hyperlipidemia: Secondary | ICD-10-CM | POA: Insufficient documentation

## 2022-03-27 DIAGNOSIS — Z6841 Body Mass Index (BMI) 40.0 and over, adult: Secondary | ICD-10-CM | POA: Insufficient documentation

## 2022-03-27 NOTE — Patient Instructions (Addendum)
Goals  Cut out sodas Cut out sweets Increase fresh fruits and vegetables Follow Lifestyle Medicine Exercise 15 mins a day   Lifestyle Medicine  - Whole Food, Plant Predominant Nutrition is highly recommended: Eat Plenty of vegetables, Mushrooms, fruits, Legumes, Whole Grains, Nuts, seeds in lieu of processed meats, processed snacks/pastries red meat, poultry, eggs.    -It is better to avoid simple carbohydrates including: Cakes, Sweet Desserts, Ice Cream, Soda (diet and regular), Sweet Tea, Candies, Chips, Cookies, Store Bought Juices, Alcohol in Excess of  1-2 drinks a day, Lemonade,  Artificial Sweeteners, Doughnuts, Coffee Creamers, "Sugar-free" Products, etc, etc.  This is not a complete list.....  Exercise: If you are able: 30 -60 minutes a day ,4 days a week, or 150 minutes a week.  The longer the better.  Combine stretch, strength, and aerobic activities.  If you were told in the past that you have high risk for cardiovascular diseases, you may seek evaluation by your heart doctor prior to initiating moderate to intense exercise programs.

## 2022-03-27 NOTE — Progress Notes (Signed)
Medical Nutrition Therapy  Appointment Start time:  1300  Appointment End time:  1400  Primary concerns today: E66.9  Referral diagnosis: Obesity Preferred learning style: No preference  Learning readiness: Ready   NUTRITION ASSESSMENT  Here with her husband.Has a sinus infection.  Complains of gaining weight and can't stop gaining. "I only drink Dr. Malachi Bonds pretty much. I hardly eat much."  Current diet is insuffient to meet her needs for desired weight loss and overall health. Diet is high in sugar and processed foods.  She and her husband are willing to work on Lifestyle Medicine to improve her health and provide needed weight loss and reduce lipids and blood pressure.  Gained 8 lbs in the last 2 months. She has metabolic syndrome with obesity, Hyperlipemia and large abdomen circumference with Fatty liver. Anthropometrics  Wt Readings from Last 3 Encounters:  03/27/22 242 lb (109.8 kg)  01/01/22 234 lb 9.6 oz (106.4 kg)  12/25/21 235 lb 1.6 oz (106.6 kg)   Ht Readings from Last 3 Encounters:  03/27/22 5' 5"  (1.651 m)  01/01/22 5' 5"  (1.651 m)  12/25/21 5' 5"  (1.651 m)   Body mass index is 40.27 kg/m. @BMIFA @ Facility age limit for growth percentiles is 20 years. Facility age limit for growth percentiles is 20 years.    Clinical Medical Hx: HTN, Obesity, Hyperlipidemia Medications: see chart Labs  4/23  TCHOL 212 mg/dl, ALK Phos 126 mg/dl. LDL 155 mg/dl.   Notable Signs/Symptoms: NO energy, sleeping a lot, increased thirst,  Lifestyle & Dietary Hx LIves with her husband. Her husband cooks mostly.  Estimated daily fluid intake: 24 oz Supplements: see chart Sleep: 8 hours =snores Stress / self-care: family issues  Current average weekly physical activity: ADL  24-Hr Dietary Recall Drinks mostly Dr. Malachi Bonds and eats nabs . Usually only eats 1-2 meals per day. Drinks 4-5 sodas per day.  Estimated Energy Needs Calories: 1200  Carbohydrate: 135 g Protein:  90g Fat: 33g   NUTRITION DIAGNOSIS  NI-1.5 Excessive energy intake As related to Drinking sodas and nabs all day.  As evidenced by 8 lbs weight gain, HTN and Hyperlipidemia.   NUTRITION INTERVENTION  Nutrition education (E-1) on the following topics:  Lifestyle Medicine  - Whole Food, Plant Predominant Nutrition is highly recommended: Eat Plenty of vegetables, Mushrooms, fruits, Legumes, Whole Grains, Nuts, seeds in lieu of processed meats, processed snacks/pastries red meat, poultry, eggs.    -It is better to avoid simple carbohydrates including: Cakes, Sweet Desserts, Ice Cream, Soda (diet and regular), Sweet Tea, Candies, Chips, Cookies, Store Bought Juices, Alcohol in Excess of  1-2 drinks a day, Lemonade,  Artificial Sweeteners, Doughnuts, Coffee Creamers, "Sugar-free" Products, etc, etc.  This is not a complete list.....  Exercise: If you are able: 30 -60 minutes a day ,4 days a week, or 150 minutes a week.  The longer the better.  Combine stretch, strength, and aerobic activities.  If you were told in the past that you have high risk for cardiovascular diseases, you may seek evaluation by your heart doctor prior to initiating moderate to intense exercise programs.   Handouts Provided Include  Lifestyle Medicine Meal Plan Card   Learning Style & Readiness for Change Teaching method utilized: Visual & Auditory  Demonstrated degree of understanding via: Teach Back  Barriers to learning/adherence to lifestyle change: none  Goals Established by Pt Goals  Cut out sodas Cut out sweets Increase fresh fruits and vegetables Follow Lifestyle Medicine Exercise 15 mins a day  MONITORING & EVALUATION Dietary intake, weekly physical activity, and water intake in 1 month.  Next Steps  Patient is to work on eating 3 meals per day and cutting out soda.

## 2022-04-29 ENCOUNTER — Encounter: Payer: Medicare HMO | Attending: Family Medicine | Admitting: Nutrition

## 2022-04-29 DIAGNOSIS — Z6841 Body Mass Index (BMI) 40.0 and over, adult: Secondary | ICD-10-CM | POA: Insufficient documentation

## 2022-04-29 DIAGNOSIS — K760 Fatty (change of) liver, not elsewhere classified: Secondary | ICD-10-CM | POA: Insufficient documentation

## 2022-04-29 DIAGNOSIS — I10 Essential (primary) hypertension: Secondary | ICD-10-CM | POA: Insufficient documentation

## 2022-04-29 DIAGNOSIS — K582 Mixed irritable bowel syndrome: Secondary | ICD-10-CM | POA: Insufficient documentation

## 2022-04-29 DIAGNOSIS — E782 Mixed hyperlipidemia: Secondary | ICD-10-CM | POA: Insufficient documentation

## 2022-04-29 NOTE — Patient Instructions (Addendum)
Goals  Lose 10 lbs by next visit. Start increasing more vegetables. Walk 15 minutes a day. Look into water bottle with times. Look into Map My Walk

## 2022-04-29 NOTE — Progress Notes (Signed)
Medical Nutrition Therapy  Appointment Start time:  1100 Appointment End time:  1130  Primary concerns today: E66.9  Referral diagnosis: Obesity Preferred learning style: No preference  Learning readiness: Ready   NUTRITION ASSESSMENT  Lost 12 lbs.  Has been working on lifestyle medicine and developing healthier eating habits. Cut out sodas, processed meats, junk food. Only eating chicken, fish. Drinking water only.  Feels better. Her IBS has drastically improved. More energy, sleeping better. No diarrhea or constipation.  Here with her husband and he has been supportive too.   Goals Previously set:  Cut out sodas-done Cut out sweets-done Increase fresh fruits and vegetables- improved Follow Lifestyle Medicine-in progress Exercise 15 mins a day-will start   Anthropometrics  Wt Readings from Last 3 Encounters:  03/27/22 242 lb (109.8 kg)  01/01/22 234 lb 9.6 oz (106.4 kg)  12/25/21 235 lb 1.6 oz (106.6 kg)   Ht Readings from Last 3 Encounters:  03/27/22 5' 5"  (1.651 m)  01/01/22 5' 5"  (1.651 m)  12/25/21 5' 5"  (1.651 m)   There is no height or weight on file to calculate BMI. @BMIFA @ Facility age limit for growth %iles is 20 years. Facility age limit for growth %iles is 20 years.    Clinical Medical Hx: HTN, Obesity, Hyperlipidemia Medications: see chart Labs  4/23  TCHOL 212 mg/dl, ALK Phos 126 mg/dl. LDL 155 mg/dl.   Notable Signs/Symptoms: NO energy, sleeping a lot, increased thirst,  Lifestyle & Dietary Hx LIves with her husband. Her husband cooks mostly.  Estimated daily fluid intake: 24 oz Supplements: see chart Sleep: 8 hours =snores Stress / self-care: family issues  Current average weekly physical activity: ADL  24-Hr Dietary Recall Drinks mostly Dr. Malachi Bonds and eats nabs . Usually only eats 1-2 meals per day. Drinks 4-5 sodas per day.  Estimated Energy Needs Calories: 1200  Carbohydrate: 135 g Protein: 90g Fat: 33g   NUTRITION DIAGNOSIS   NI-1.5 Excessive energy intake As related to Drinking sodas and nabs all day.  As evidenced by 8 lbs weight gain, HTN and Hyperlipidemia.   NUTRITION INTERVENTION  Nutrition education (E-1) on the following topics:  Lifestyle Medicine  - Whole Food, Plant Predominant Nutrition is highly recommended: Eat Plenty of vegetables, Mushrooms, fruits, Legumes, Whole Grains, Nuts, seeds in lieu of processed meats, processed snacks/pastries red meat, poultry, eggs.    -It is better to avoid simple carbohydrates including: Cakes, Sweet Desserts, Ice Cream, Soda (diet and regular), Sweet Tea, Candies, Chips, Cookies, Store Bought Juices, Alcohol in Excess of  1-2 drinks a day, Lemonade,  Artificial Sweeteners, Doughnuts, Coffee Creamers, "Sugar-free" Products, etc, etc.  This is not a complete list.....  Exercise: If you are able: 30 -60 minutes a day ,4 days a week, or 150 minutes a week.  The longer the better.  Combine stretch, strength, and aerobic activities.  If you were told in the past that you have high risk for cardiovascular diseases, you may seek evaluation by your heart doctor prior to initiating moderate to intense exercise programs.   Handouts Provided Include  Lifestyle Medicine Meal Plan Card   Learning Style & Readiness for Change Teaching method utilized: Visual & Auditory  Demonstrated degree of understanding via: Teach Back  Barriers to learning/adherence to lifestyle change: none  Goals Established by Pt  Lose 10 lbs by next visit. Start increasing more vegetables. Walk 15 minutes a day. Look into water bottle with times. Look into Map My Walk    MONITORING & EVALUATION  Dietary intake, weekly physical activity, and water intake in 2 month.s  Next Steps  Patient is to work on eating 3 meals per day and meal prep.

## 2022-04-30 ENCOUNTER — Encounter: Payer: Self-pay | Admitting: Nutrition

## 2022-07-01 ENCOUNTER — Encounter: Payer: Self-pay | Admitting: Nutrition

## 2022-07-01 ENCOUNTER — Ambulatory Visit: Payer: Medicare HMO | Admitting: Nutrition

## 2022-07-01 ENCOUNTER — Encounter: Payer: Medicare HMO | Attending: Family Medicine | Admitting: Nutrition

## 2022-07-01 VITALS — Ht 65.0 in | Wt 227.0 lb

## 2022-07-01 DIAGNOSIS — K582 Mixed irritable bowel syndrome: Secondary | ICD-10-CM

## 2022-07-01 DIAGNOSIS — Z6841 Body Mass Index (BMI) 40.0 and over, adult: Secondary | ICD-10-CM | POA: Insufficient documentation

## 2022-07-01 DIAGNOSIS — K760 Fatty (change of) liver, not elsewhere classified: Secondary | ICD-10-CM

## 2022-07-01 DIAGNOSIS — I10 Essential (primary) hypertension: Secondary | ICD-10-CM

## 2022-07-01 DIAGNOSIS — E782 Mixed hyperlipidemia: Secondary | ICD-10-CM

## 2022-07-01 NOTE — Patient Instructions (Addendum)
  Goals Previously set: Exercise 30 minutes 5 times per week Continue to increase fruits, vegetables and plant based foods. Keep drinking water       Make an appt with a therapist.       Don't skip meals

## 2022-07-01 NOTE — Progress Notes (Signed)
Medical Nutrition Therapy   0800 End  -830  Primary concerns today: Obesity and Fatty liver Referral diagnosis: e66.9, K76.0 Preferred learning style: No preference  Learning readiness: Ready   NUTRITION ASSESSMENT  Follow up Obesity, fatty liver and IBS. Had cataract surgery on both eyes. Hasn't been able to exercise due to this. Cut out snacks. No unsweet tea. Focusing on more plant based foods. Trying new foods. Still struggles with her binge eating at times, especially when has a lot of stress going on and skips meals. Drinking water. Lost 3 lbs. She is willing to work with a binge eating therapist. Husband is very supportive and they have made healthier choices together. Will start exercising once she is allowed to do so from her eye surgeon.   Anthropometrics  Wt Readings from Last 3 Encounters:  04/29/22 230 lb (104.3 kg)  03/27/22 242 lb (109.8 kg)  01/01/22 234 lb 9.6 oz (106.4 kg)   Ht Readings from Last 3 Encounters:  04/29/22 _0  (1.651 m)  03/27/22 _1  (1.651 m)  01/01/22 _2  (1.651 m)   There is no height or weight on file to calculate BMI. _3 @ Facility age limit for growth %iles is 20 years. Facility age limit for growth %iles is 20 years.    Clinical Medical Hx: HTN, Obesity, Hyperlipidemia Medications: see chart Labs  4/23  TCHOL 212 mg/dl, ALK Phos 126 mg/dl. LDL 155 mg/dl.  Notable Signs/Symptoms: NO energy, sleeping a lot, increased thirst,  Lifestyle & Dietary Hx LIves with her husband. Her husband cooks mostly.  Estimated daily fluid intake: 24 oz Supplements: see chart Sleep: 8 hours =snores Stress / self-care: family issues  Current average weekly physical activity: ADL  24-Hr Dietary Recall Drinks mostly Dr. Malachi Bonds and eats nabs . Usually only eats 1-2 meals per day. Drinks 4-5 sodas per day.  Estimated Energy Needs Calories: 1200  Carbohydrate: 135 g Protein: 90g Fat: 33g   NUTRITION DIAGNOSIS  NI-1.5 Excessive  energy intake As related to Drinking sodas and nabs all day.  As evidenced by 8 lbs weight gain, HTN and Hyperlipidemia.   NUTRITION INTERVENTION  Nutrition education (E-1) on the following topics:  Lifestyle Medicine  - Whole Food, Plant Predominant Nutrition is highly recommended: Eat Plenty of vegetables, Mushrooms, fruits, Legumes, Whole Grains, Nuts, seeds in lieu of processed meats, processed snacks/pastries red meat, poultry, eggs.    -It is better to avoid simple carbohydrates including: Cakes, Sweet Desserts, Ice Cream, Soda (diet and regular), Sweet Tea, Candies, Chips, Cookies, Store Bought Juices, Alcohol in Excess of  1-2 drinks a day, Lemonade,  Artificial Sweeteners, Doughnuts, Coffee Creamers, "Sugar-free" Products, etc, etc.  This is not a complete list.....  Exercise: If you are able: 30 -60 minutes a day ,4 days a week, or 150 minutes a week.  The longer the better.  Combine stretch, strength, and aerobic activities.  If you were told in the past that you have high risk for cardiovascular diseases, you may seek evaluation by your heart doctor prior to initiating moderate to intense exercise programs.   Handouts Provided Include  Lifestyle Medicine Meal Plan Card   Learning Style & Readiness for Change Teaching method utilized: Visual & Auditory  Demonstrated degree of understanding via: Teach Back  Barriers to learning/adherence to lifestyle change: none  Goals Established by Pt  Goals  Exercise 30 minutes 5 times per week Continue to increase fruits, vegetables and plant based foods Keep drinking water  Make an appt with a therapist.  MONITORING & EVALUATION Dietary intake, weekly physical activity, and water intake in 3 months  Next Steps  Patient is to work on eating 3 meals per day and meal prep.

## 2022-10-01 ENCOUNTER — Ambulatory Visit: Payer: Medicare HMO | Admitting: Nutrition

## 2022-11-25 ENCOUNTER — Ambulatory Visit: Payer: Medicare HMO | Admitting: Nutrition

## 2022-12-17 ENCOUNTER — Ambulatory Visit: Payer: Medicare HMO | Admitting: Nutrition

## 2023-01-01 ENCOUNTER — Ambulatory Visit: Payer: Medicare HMO | Admitting: Nutrition

## 2023-03-25 NOTE — Progress Notes (Unsigned)
Referring Provider: Richardean Chimera, MD Primary Care Physician:  Richardean Chimera, MD Primary GI Physician: Dr. Marletta Lor  Chief Complaint  Patient presents with   Dysphagia    HPI:   Alexa Wright is a 57 y.o. female with history of GERD, dysphagia, eosinophilic esophagitis, prior esophageal dilation in 2022, elevated LFTs, presenting today with chief complaint of dysphagia and GERD.  Last seen in our office 01/01/2022.  Reported she was attempting to adhere to a 6 food illumination diet though it had been difficult, especially dairy.  Also noted PCP recently completed blood work that showed abnormal liver function test.  Denies history of alcohol use, illicit drug use.  Recommended continuing PPI twice daily, tentatively planning on repeat EGD with esophageal biopsies in 3-6 months, complete additional laboratory evaluation for elevated LFTs as well as ultrasound with elastography.  Labs completed 01/01/2022 with normalization of LFTs, autoimmune labs, celiac screen, acute hepatitis panel, iron levels within normal limits.  She was immune to hepatitis B.  Ultrasound with elastography 01/09/2022 with hepatic steatosis, median K PA 3.7.  Today: Having a lot of reflux, worse at night, for the last couple of months.  Also reports that she has not been eating well for the last couple of months.  She is eating out a lot.  States her life is stressful, caring for 2 young grandchildren, her parents that are in their 17s, and also her diabetic husband.  She does try to use avoid eating within 2 hours of going to bed.  Notices reflux will be worse if she eats chocolate recommend based products.  Occasional epigastric pain.  No nausea, vomiting, BRBPR, melena.  For the last few weeks, she is noticed some worsening trouble with swallowing, foods are getting stuck in her esophagus, worse with bread and chicken, but also Curnes and soft foods.  He does not have to bring food back up.  She is also dealing  with a blocked saliva gland on the right and is currently on antibiotics.  Colonoscopy by Dr. Aurea Graff 03/22/2020 with 1 polyp removed and recommended 5-year recall.   Past Medical History:  Diagnosis Date   Asthma    Bradycardia    Degenerative disc disease    Depression    Eosinophilic esophagitis    Fibromyalgia    Gastroesophageal reflux disease    Hypertension    Migraine    Obesity    PAC (premature atrial contraction)     Past Surgical History:  Procedure Laterality Date   ACHILLES TENDON REPAIR Left    BALLOON DILATION N/A 08/06/2021   Procedure: BALLOON DILATION;  Surgeon: Lanelle Bal, DO;  Location: [facility];  Service: Endoscopy;  Laterality: N/A;   BIOPSY  08/06/2021   Procedure: BIOPSY;  Surgeon: Lanelle Bal, DO;  Location: [facility];  Service: Endoscopy;;   ESOPHAGOGASTRODUODENOSCOPY (EGD) WITH PROPOFOL N/A 08/06/2021   Procedure: ESOPHAGOGASTRODUODENOSCOPY (EGD) WITH PROPOFOL;  Surgeon: Lanelle Bal, DO;  Location: [facility];  Service: Endoscopy;  Laterality: N/A;  8:15am   GALLBLADDER SURGERY      Current Outpatient Medications  Medication Sig Dispense Refill   albuterol (VENTOLIN HFA) 108 (90 Base) MCG/ACT inhaler Inhale 2 puffs into the lungs every 6 (six) hours as needed for wheezing or shortness of breath.     ALPRAZolam (XANAX) 0.5 MG tablet Take 0.5 mg by mouth 3 (three) times daily as needed for sleep. For sleep or anxiety     doxycycline (  VIBRA-TABS) 100 MG tablet Take 100 mg by mouth 2 (two) times daily.     fluticasone-salmeterol (ADVAIR HFA) 45-21 MCG/ACT inhaler Inhale into the lungs.     loratadine (CLARITIN) 10 MG tablet Take 10 mg by mouth daily as needed.     pantoprazole (PROTONIX) 40 MG tablet Take 1 tablet (40 mg total) by mouth 2 (two) times daily. 60 tablet 3   rosuvastatin (CRESTOR) 5 MG tablet Take 5 mg by mouth daily.     valsartan-hydrochlorothiazide (DIOVAN-HCT) 160-12.5 MG tablet Take 1 tablet by mouth daily.      Vitamin D, Ergocalciferol, (DRISDOL) 1.25 MG (50000 UNIT) CAPS capsule Take 50,000 Units by mouth once a week.     No current facility-administered medications for this visit.    Allergies as of 03/26/2023 - Review Complete 03/26/2023  Allergen Reaction Noted   Bee pollen Swelling 10/31/2014   Dilaudid [hydromorphone hcl] Itching 05/15/2012   Fentanyl Other (See Comments) 07/31/2021   Hydrocodone Other (See Comments) 05/15/2012   Lisinopril Cough 03/08/2015   Morphine and codeine  03/04/2013   Paroxetine hcl Other (See Comments) 08/26/2011   Duloxetine Nausea Only 08/26/2011   Etodolac Nausea And Vomiting 08/26/2011   Latex Rash 03/04/2013   Milnacipran  02/19/2011    Family History  Problem Relation Age of Onset   Esophageal cancer Mother        Living   Heart attack Father        Living   Mental retardation Sister    Migraines Sister    Migraines Sister    Migraines Daughter    Colon cancer Neg Hx    Gastric cancer Neg Hx     Social History   Socioeconomic History   Marital status: Married    Spouse name: Not on file   Number of children: Not on file   Years of education: Not on file   Highest education level: Not on file  Occupational History   Not on file  Tobacco Use   Smoking status: Former    Types: Cigarettes    Quit date: 01/31/1992    Years since quitting: 31.1   Smokeless tobacco: Never  Vaping Use   Vaping Use: Never used  Substance and Sexual Activity   Alcohol use: No    Alcohol/week: 0.0 standard drinks of alcohol   Drug use: No   Sexual activity: Yes  Other Topics Concern   Not on file  Social History Narrative   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  She is on disability for ?bipolar disorder and fibromyalgia in January 2012.   She lives at home with her husband.   Highest level of education:  Associated degree.    Social Determinants of Health   Financial Resource Strain: Not on file  Food  Insecurity: Not on file  Transportation Needs: Not on file  Physical Activity: Not on file  Stress: Not on file  Social Connections: Not on file    Review of Systems: Gen: Denies fever, chills, left leg symptoms, presyncope, syncope. CV: Denies chest pain, palpitations. Resp: Denies dyspnea, cough. GI: See HPI  Heme: See HPI  Physical Exam: BP (!) 136/90 (BP Location: Right Arm, Patient Position: Sitting, Cuff Size: Large)   Pulse (!) 105   Temp 98.2 F (36.8 C) (Oral)   Ht 5\' 5"  (1.651 m)   Wt 242 lb 12.8 oz (110.1 kg)   LMP 03/10/2019 (Approximate)   SpO2 98%  BMI 40.40 kg/m  General:   Alert and oriented. No distress noted. Pleasant and cooperative.  Head:  Normocephalic and atraumatic. Eyes:  Conjuctiva clear without scleral icterus. Heart:  S1, S2 present without murmurs appreciated. Lungs:  Clear to auscultation bilaterally. No wheezes, rales, or rhonchi. No distress.  Abdomen:  +BS, soft, and non-distended.  Mild TTP in epigastric area.  No rebound or guarding. No HSM or masses noted. Msk:  Symmetrical without gross deformities. Normal posture. Extremities:  Without edema. Neurologic:  Alert and  oriented x4 Psych:  Normal mood and affect.    Assessment:  57 year old female with history of GERD, dysphagia, eosinophilic esophagitis, prior esophageal dilation in 2022, fatty liver, presenting today with chief complaint of GERD and dysphagia.  GERD: Uncontrolled.  Likely dietary related, but could be losing effect to Nexium as she has been on this for a while.  Will change Nexium to pantoprazole.  Counseled extensively on the importance of dietary changes.  Dysphagia: Had been doing well until the last few weeks where she had recurrent solid and solid food dysphagia.  No regurgitation.  Suspect this is likely secondary to uncontrolled GERD which has probably flared her underlying EOE.  She is never required any steroids for EOE.  She did not continue with the 6 food  lamination diet for any length of time as this was very difficult to follow.  For now, we will focus on GERD management and repeating an upper endoscopy with possible esophageal dilation. Holding off on topical steroids for now.    Plan:  Proceed with upper endoscopy +/- dilation with propofol by Dr. Marletta Lor in near future. The risks, benefits, and alternatives have been discussed with the patient in detail. The patient states understanding and desires to proceed.  ASA 3 Eat slowly, take small bites, chew thoroughly, drink plenty of liquids throughout meals, avoid tough textures, chopped meats finally.  ER evaluation if something becomes hung in her esophagus that will not come up or go down. Stop Nexium. Start pantoprazole 40 mg twice daily. Reinforced GERD diet/lifestyle.  Written instructions provided on AVS. Follow-up after EGD.   Ermalinda Memos, PA-C [facility] 03/26/2023

## 2023-03-26 ENCOUNTER — Encounter: Payer: Self-pay | Admitting: *Deleted

## 2023-03-26 ENCOUNTER — Ambulatory Visit: Payer: Medicare HMO | Admitting: Gastroenterology

## 2023-03-26 ENCOUNTER — Encounter: Payer: Self-pay | Admitting: Gastroenterology

## 2023-03-26 VITALS — BP 136/90 | HR 105 | Temp 98.2°F | Ht 65.0 in | Wt 242.8 lb

## 2023-03-26 DIAGNOSIS — K200 Eosinophilic esophagitis: Secondary | ICD-10-CM

## 2023-03-26 DIAGNOSIS — K219 Gastro-esophageal reflux disease without esophagitis: Secondary | ICD-10-CM | POA: Diagnosis not present

## 2023-03-26 MED ORDER — PANTOPRAZOLE SODIUM 40 MG PO TBEC: 40.0000 mg | DELAYED_RELEASE_TABLET | Freq: Two times a day (BID) | ORAL | 3 refills | Status: AC

## 2023-03-26 NOTE — Patient Instructions (Addendum)
Stop esomeprazole and start pantoprazole 40 mg twice daily 30 minutes before breakfast and dinner.  Follow a GERD diet:  Avoid fried, fatty, greasy, spicy, citrus foods. Avoid caffeine and carbonated beverages. Avoid chocolate. Try eating 4-6 small meals a day rather than 3 large meals. Do not eat within 3 hours of laying down. Prop head of bed up on wood or bricks to create a 6 inch incline.  We will arrange you to have an upper endoscopy with possible stretching of your esophagus in the near future with Dr. Marletta Lor.  Swallowing precautions:  Eat slowly, take small bites, chew thoroughly, drink plenty of liquids throughout meals.  Avoid trough textures All meats should be chopped finely.  If something gets hung in your esophagus and will not come up or go down, proceed to the emergency room.     We will follow-up with you in the office after your procedure.  Do not hesitate to call sooner if you have questions or concerns.  It was nice to meet you today!   Ermalinda Memos, PA-C [facility]

## 2023-03-31 ENCOUNTER — Telehealth: Payer: Self-pay | Admitting: *Deleted

## 2023-03-31 NOTE — Patient Instructions (Signed)
Alexa Wright  03/31/2023     @PREFPERIOPPHARMACY @   Your procedure is scheduled on 04/03/23.  Report to [facility] at 1230 P.M.  Call this number if you have problems the morning of surgery:  [phone]  If you experience any cold or flu symptoms such as cough, fever, chills, shortness of breath, etc. between now and your scheduled surgery, please notify us at the above number.   Remember:         Please follow diet  instructions from Dr. Darolyn Rua office                Take these medicines the morning of surgery with A SIP OF WATER Xanax, claritin, protonix and albuterol as needed.    Do not wear jewelry, make-up or nail polish, including gel polish,  artificial nails, or any other type of covering on natural nails (fingers and  toes).  Do not wear lotions, powders, or perfumes, or deodorant.  Do not shave 48 hours prior to surgery.  Men may shave face and neck.  Do not bring valuables to the hospital.  [facility] is not responsible for any belongings or valuables.  Contacts, dentures or bridgework may not be worn into surgery.  Leave your suitcase in the car.  After surgery it may be brought to your room.  For patients admitted to the hospital, discharge time will be determined by your treatment team.  Patients discharged the day of surgery will not be allowed to drive home.   Name and phone number of your driver:   family Special instructions:  N/A  Please read over the following fact sheets that you were given. Care and Recovery After Surgery  Upper Endoscopy, Adult Upper endoscopy is a procedure to look inside the upper GI (gastrointestinal) tract. The upper GI tract is made up of: The esophagus. This is the part of the body that moves food from your mouth to your stomach. The stomach. The duodenum. This is the first part of your small intestine. This procedure is also called esophagogastroduodenoscopy (EGD) or gastroscopy. In this procedure, your health care  provider passes a thin, flexible tube (endoscope) through your mouth and down your esophagus into your stomach and into your duodenum. A small camera is attached to the end of the tube. Images from the camera appear on a monitor in the exam room. During this procedure, your health care provider may also remove a small piece of tissue to be sent to a lab and examined under a microscope (biopsy). Your health care provider may do an upper endoscopy to diagnose cancers of the upper GI tract. You may also have this procedure to find the cause of other conditions, such as: Stomach pain. Heartburn. Pain or problems when swallowing. Nausea and vomiting. Stomach bleeding. Stomach ulcers. Tell a health care provider about: Any allergies you have. All medicines you are taking, including vitamins, herbs, eye drops, creams, and over-the-counter medicines. Any problems you or family members have had with anesthetic medicines. Any bleeding problems you have. Any surgeries you have had. Any medical conditions you have. Whether you are pregnant or may be pregnant. What are the risks? Your healthcare provider will talk with you about risks. These may include: Infection. Bleeding. Allergic reactions to medicines. A tear or hole (perforation) in the esophagus, stomach, or duodenum. What happens before the procedure? When to stop eating and drinking Follow instructions from your health care provider about what you may eat and drink. These  may include: 8 hours before your procedure Stop eating most foods. Do not eat meat, fried foods, or fatty foods. Eat only light foods, such as toast or crackers. All liquids are okay except energy drinks and alcohol. 6 hours before your procedure Stop eating. Drink only clear liquids, such as water, clear fruit juice, black coffee, plain tea, and sports drinks. Do not drink energy drinks or alcohol. 2 hours before your procedure Stop drinking all liquids. You may be  allowed to take medicines with small sips of water. If you do not follow your health care provider's instructions, your procedure may be delayed or canceled. Medicines Ask your health care provider about: Changing or stopping your regular medicines. This is especially important if you are taking diabetes medicines or blood thinners. Taking medicines such as aspirin and ibuprofen. These medicines can thin your blood. Do not take these medicines unless your health care provider tells you to take them. Taking over-the-counter medicines, vitamins, herbs, and supplements. General instructions If you will be going home right after the procedure, plan to have a responsible adult: Take you home from the hospital or clinic. You will not be allowed to drive. Care for you for the time you are told. What happens during the procedure?  An IV will be inserted into one of your veins. You may be given one or more of the following: A medicine to help you relax (sedative). A medicine to numb the throat (local anesthetic). You will lie on your left side on an exam table. Your health care provider will pass the endoscope through your mouth and down your esophagus. Your health care provider will use the scope to check the inside of your esophagus, stomach, and duodenum. Biopsies may be taken. The endoscope will be removed. The procedure may vary among health care providers and hospitals. What happens after the procedure? Your blood pressure, heart rate, breathing rate, and blood oxygen level will be monitored until you leave the hospital or clinic. When your throat is no longer numb, you may be given some fluids to drink. If you were given a sedative during the procedure, it can affect you for several hours. Do not drive or operate machinery until your health care provider says that it is safe. It is up to you to get the results of your procedure. Ask your health care provider, or the department that is doing  the procedure, when your results will be ready. Contact a health care provider if you: Have a sore throat that lasts longer than 1 day. Have a fever. Get help right away if you: Vomit blood or your vomit looks like coffee grounds. Have bloody, black, or tarry stools. Have a very bad sore throat or you cannot swallow. Have difficulty breathing or very bad pain in your chest or abdomen. These symptoms may be an emergency. Get help right away. Call 911. Do not wait to see if the symptoms will go away. Do not drive yourself to the hospital. Summary Upper endoscopy is a procedure to look inside the upper GI tract. During the procedure, an IV will be inserted into one of your veins. You may be given a medicine to help you relax. The endoscope will be passed through your mouth and down your esophagus. Follow instructions from your health care provider about what you can eat and drink. This information is not intended to replace advice given to you by your health care provider. Make sure you discuss any questions you have with  your health care provider. Document Revised: 01/29/2022 Document Reviewed: 01/29/2022 Elsevier Patient Education  2024 [employer]  Monitored Anesthesia Care, Care After The following information offers guidance on how to care for yourself after your procedure. Your health care provider may also give you more specific instructions. If you have problems or questions, contact your health care provider. What can I expect after the procedure? After the procedure, it is common to have: Tiredness. Little or no memory about what happened during or after the procedure. Impaired judgment when it comes to making decisions. Nausea or vomiting. Some trouble with balance. Follow these instructions at home: For the time period you were told by your health care provider:  Rest. Do not participate in activities where you could fall or become injured. Do not drive or use  machinery. Do not drink alcohol. Do not take sleeping pills or medicines that cause drowsiness. Do not make important decisions or sign legal documents. Do not take care of children on your own. Medicines Take over-the-counter and prescription medicines only as told by your health care provider. If you were prescribed antibiotics, take them as told by your health care provider. Do not stop using the antibiotic even if you start to feel better. Eating and drinking Follow instructions from your health care provider about what you may eat and drink. Drink enough fluid to keep your urine pale yellow. If you vomit: Drink clear fluids slowly and in small amounts as you are able. Clear fluids include water, ice chips, low-calorie sports drinks, and fruit juice that has water added to it (diluted fruit juice). Eat light and bland foods in small amounts as you are able. These foods include bananas, applesauce, rice, lean meats, toast, and crackers. General instructions  Have a responsible adult stay with you for the time you are told. It is important to have someone help care for you until you are awake and alert. If you have sleep apnea, surgery and some medicines can increase your risk for breathing problems. Follow instructions from your health care provider about wearing your sleep device: When you are sleeping. This includes during daytime naps. While taking prescription pain medicines, sleeping medicines, or medicines that make you drowsy. Do not use any products that contain nicotine or tobacco. These products include cigarettes, chewing tobacco, and vaping devices, such as e-cigarettes. If you need help quitting, ask your health care provider. Contact a health care provider if: You feel nauseous or vomit every time you eat or drink. You feel light-headed. You are still sleepy or having trouble with balance after 24 hours. You get a rash. You have a fever. You have redness or swelling around  the IV site. Get help right away if: You have trouble breathing. You have new confusion after you get home. These symptoms may be an emergency. Get help right away. Call 911. Do not wait to see if the symptoms will go away. Do not drive yourself to the hospital. This information is not intended to replace advice given to you by your health care provider. Make sure you discuss any questions you have with your health care provider. Document Revised: 03/17/2022 Document Reviewed: 03/17/2022 Elsevier Patient Education  2024 [employer].

## 2023-03-31 NOTE — Telephone Encounter (Signed)
Noted  

## 2023-03-31 NOTE — Telephone Encounter (Signed)
Pt on for procedure 5/31 with Dr. Marletta Lor and wants to cancel for now. Reports she has been in hospital in [place] since Thursday. She passed out when she got home. Sodium/potassium was low per pt and has been in hospital since. Not sure when she is being discharged. Did not want to r/s at this time. Endo aware to cancel. FYI

## 2023-04-01 ENCOUNTER — Encounter (HOSPITAL_COMMUNITY)
Admission: RE | Admit: 2023-04-01 | Discharge: 2023-04-01 | Disposition: A | Discharge status: Home / Self Care | Payer: Medicare HMO | Source: Ambulatory Visit | Attending: Internal Medicine | Admitting: Internal Medicine

## 2023-04-01 DIAGNOSIS — Z01818 Encounter for other preprocedural examination: Secondary | ICD-10-CM

## 2023-04-02 IMAGING — US US ABDOMEN LIMITED W/ ELASTOGRAPHY
2 series · 12 of 25 positions shown · non-contrast
Comparison: None.

CLINICAL DATA: Abnormal LFTs.

EXAM:
US ABDOMEN LIMITED - RIGHT UPPER QUADRANT
ULTRASOUND HEPATIC ELASTOGRAPHY
TECHNIQUE: Sonography of the right upper quadrant was performed. In addition,
ultrasound elastography evaluation of the liver was performed. A
region of interest was placed within the right lobe of the liver.
Following application of a compressive sonographic pulse, tissue
compressibility was assessed. Multiple assessments were performed at
the selected site. Median tissue compressibility was determined.
Previously, hepatic stiffness was assessed by shear wave velocity.
Based on recently published Society of Radiologists in Ultrasound
consensus article, reporting is now recommended to be performed in
the SI units of pressure (kiloPascals) representing hepatic
stiffness/elasticity. The obtained result is compared to the
published reference standards. (cACLD = compensated Advanced Chronic
Liver Disease)

[Series 1: us abdomen ruq w/elastography · 9 of 65 slices shown]
[im 4/65]
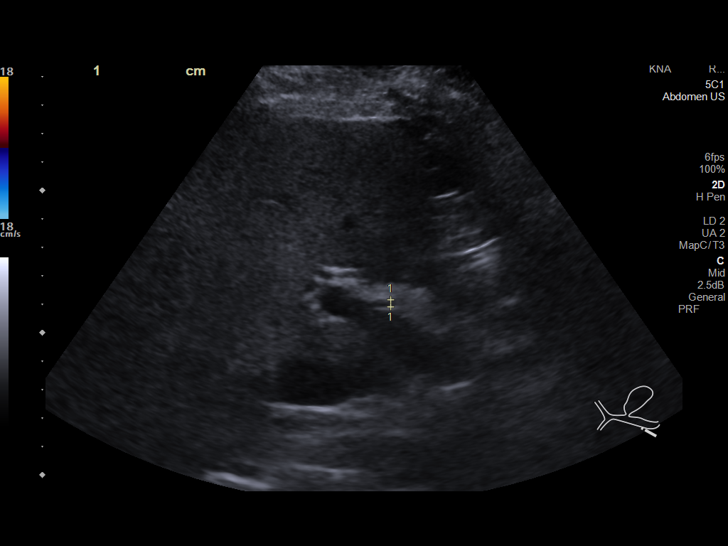
[im 12/65]
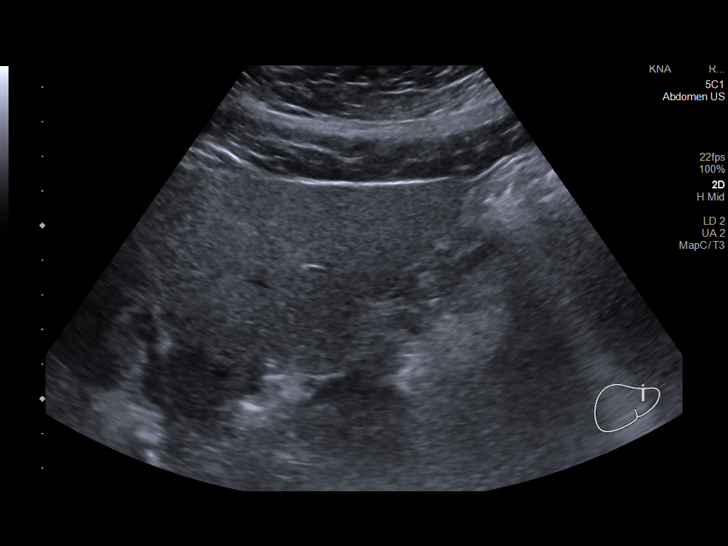
[im 19/65]
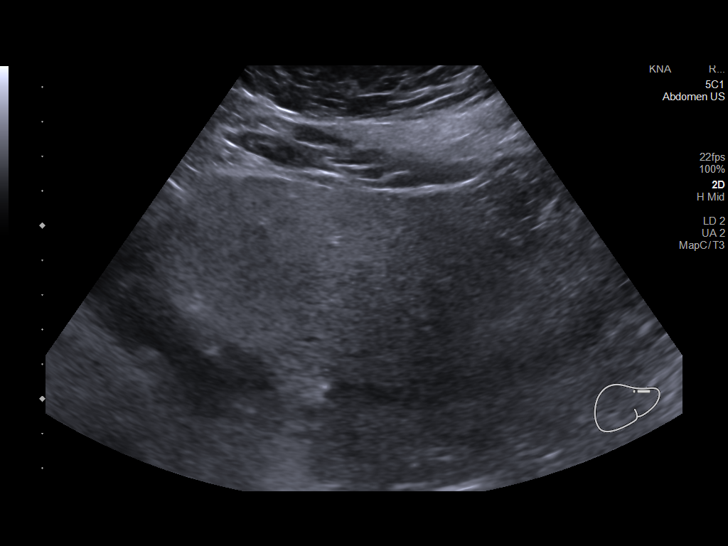
[im 27/65]
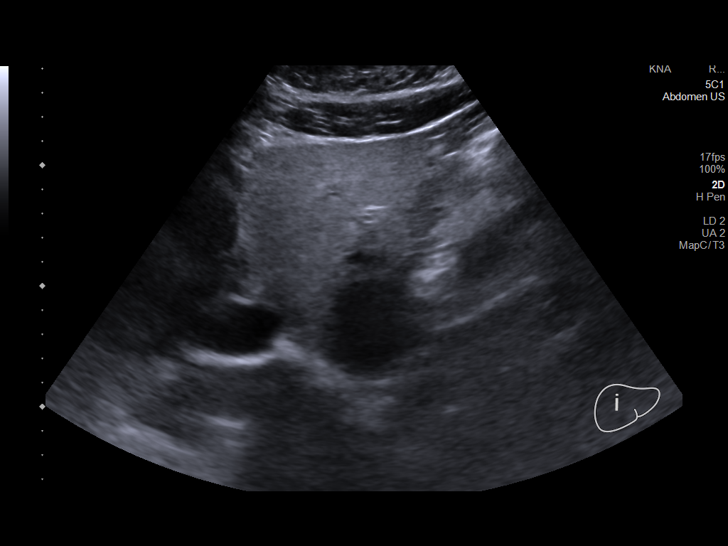
[im 34/65]
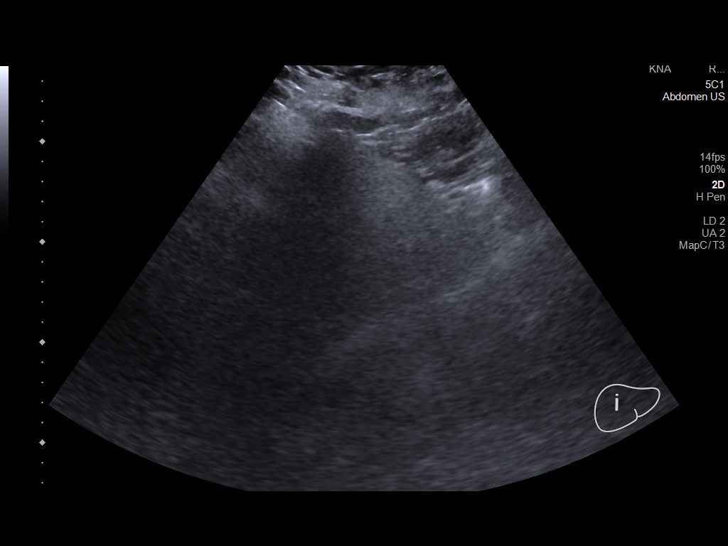
[im 42/65]
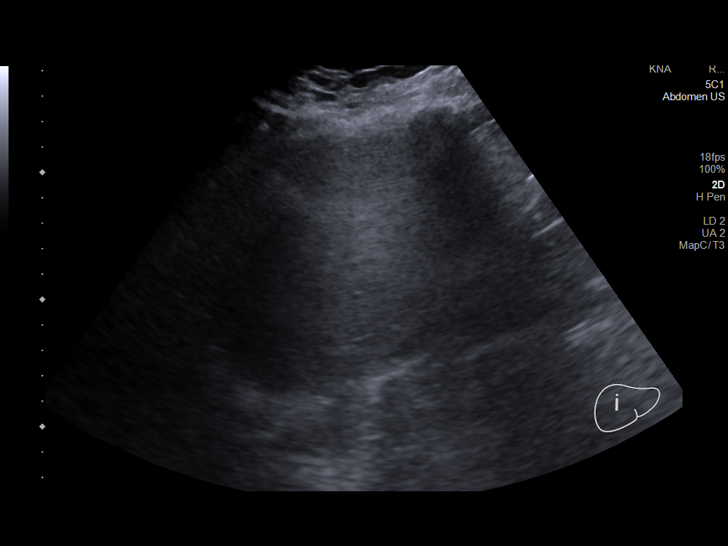
[im 49/65]
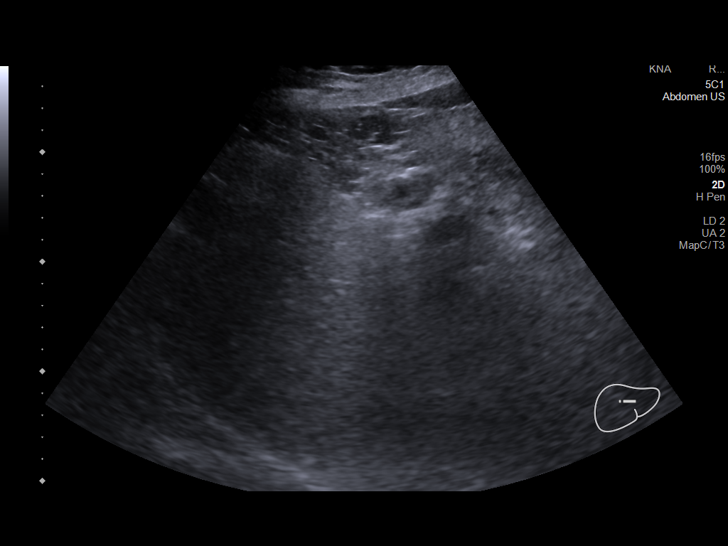
[im 57/65]
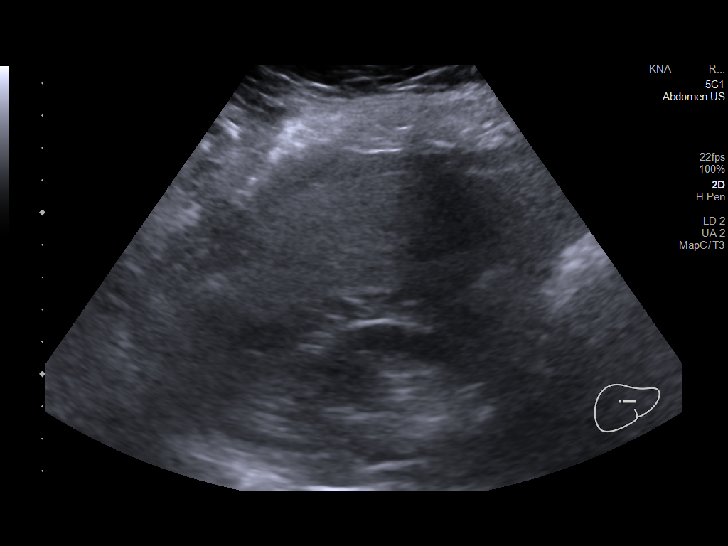
[im 65/65]
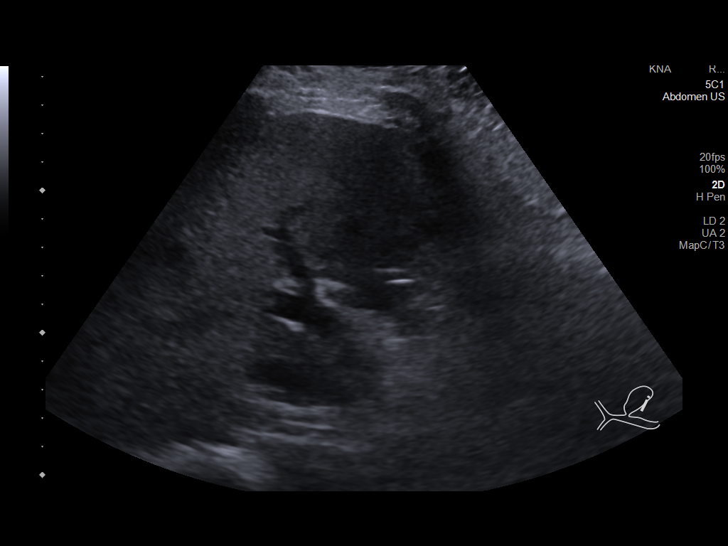

[Series 1001: abdomen us · 3 of 25 slices shown]
[im 5/25]
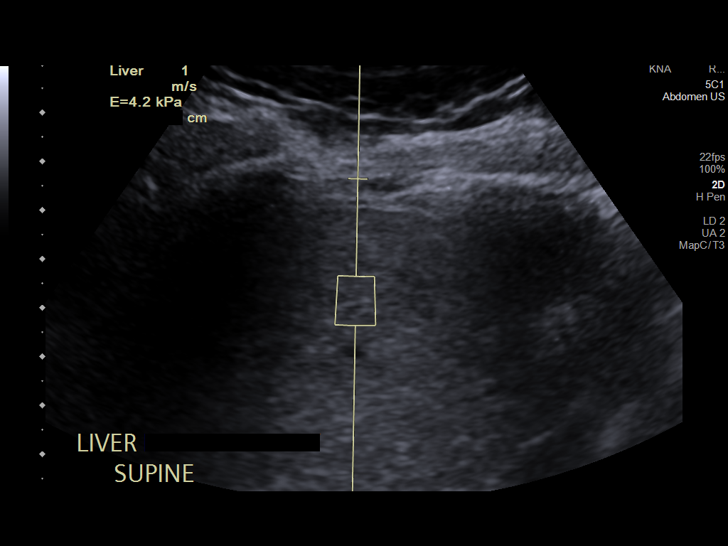
[im 13/25]
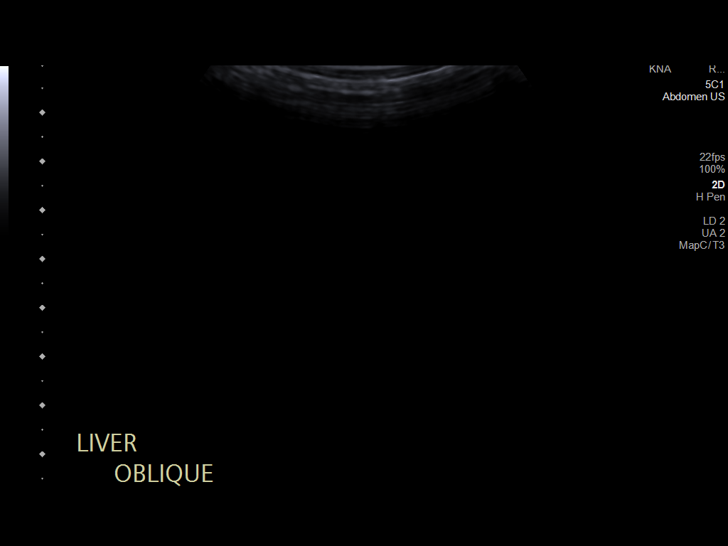
[im 21/25]
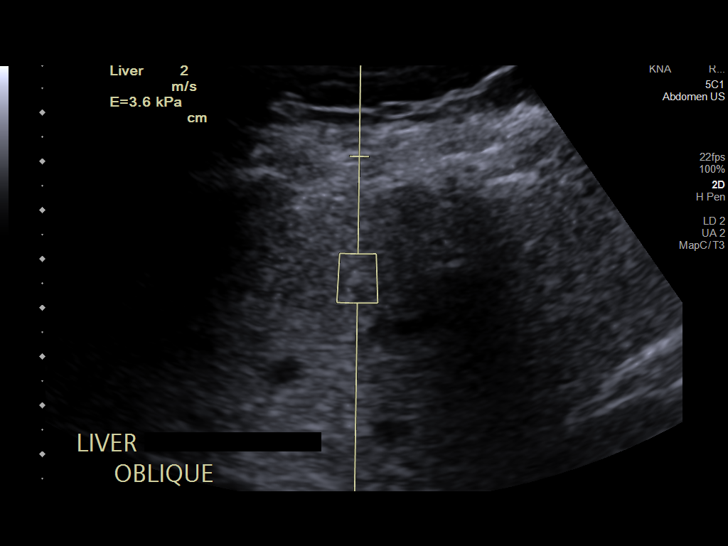

[12 of 25 positions shown; findings below may reference images not displayed]

FINDINGS: ULTRASOUND ABDOMEN LIMITED RIGHT UPPER QUADRANT

Gallbladder:

Surgically absent

Common bile duct:

Diameter: 3 mm

Liver:

No focal lesion identified. Coarsened hepatic echotexture with
increased parenchymal echogenicity. Portal vein is patent on color
Doppler imaging with normal direction of blood flow towards the
liver.

ULTRASOUND HEPATIC ELASTOGRAPHY

Device: Siemens Helix VTQ

Patient position: Supine

Transducer 5C1

Number of measurements: 10

Hepatic segment:  8

Median kPa:

IQR:

IQR/Median kPa ratio:

Data quality:  Good

Diagnostic category:  < or = 5 kPa: high probability of being normal

The use of hepatic elastography is applicable to patients with viral
hepatitis and non-alcoholic fatty liver disease. At this time, there
is insufficient data for the referenced cut-off values and use in
other causes of liver disease, including alcoholic liver disease.
Patients, however, may be assessed by elastography and serve as
their own reference standard/baseline.

In patients with non-alcoholic liver disease, the values suggesting
compensated advanced chronic liver disease (cACLD) may be lower, and
patients may need additional testing with elasticity results of [DATE]
kPa.

Please note that abnormal hepatic elasticity and shear wave
velocities may also be identified in clinical settings other than
with hepatic fibrosis, such as: acute hepatitis, elevated right
heart and central venous pressures including use of beta blockers,
[place] disease ([place]), infiltrative processes such as
mastocytosis/amyloidosis/infiltrative tumor/lymphoma, extrahepatic
cholestasis, with hyperemia in the post-prandial state, and with
liver transplantation. Correlation with patient history, laboratory
data, and clinical condition recommended.

Diagnostic Categories:

< or =5 kPa: high probability of being normal

< or =9 kPa: in the absence of other known clinical signs, rules [DATE] kPa and ?13 kPa: suggestive of cACLD, but needs further testing

>13 kPa: highly suggestive of cACLD

> or =17 kPa: highly suggestive of cACLD with an increased
probability of clinically significant portal hypertension
IMPRESSION: ULTRASOUND RUQ:

Coarsened hepatic echotexture with increased parenchymal
echogenicity is nonspecific but most commonly reflecting hepatic
steatosis. No discrete hepatic lesion identified.

ULTRASOUND HEPATIC ELASTOGRAPHY:

Median kPa:

Diagnostic category:  < or = 5 kPa: high probability of being normal

## 2023-04-03 ENCOUNTER — Ambulatory Visit (HOSPITAL_COMMUNITY): Admission: RE | Admit: 2023-04-03 | Payer: Medicare HMO | Source: Home / Self Care

## 2023-04-03 ENCOUNTER — Encounter (HOSPITAL_COMMUNITY): Admission: RE | Payer: Self-pay | Source: Home / Self Care

## 2023-04-03 SURGERY — ESOPHAGOGASTRODUODENOSCOPY (EGD) WITH PROPOFOL
Anesthesia: Monitor Anesthesia Care

## 2023-08-20 ENCOUNTER — Other Ambulatory Visit (HOSPITAL_BASED_OUTPATIENT_CLINIC_OR_DEPARTMENT_OTHER): Payer: Self-pay

## 2023-08-20 DIAGNOSIS — R0683 Snoring: Secondary | ICD-10-CM

## 2023-08-20 DIAGNOSIS — R5383 Other fatigue: Secondary | ICD-10-CM

## 2023-08-20 DIAGNOSIS — R0681 Apnea, not elsewhere classified: Secondary | ICD-10-CM

## 2023-09-02 ENCOUNTER — Ambulatory Visit (HOSPITAL_BASED_OUTPATIENT_CLINIC_OR_DEPARTMENT_OTHER): Payer: Medicare HMO | Admitting: Internal Medicine

## 2023-09-02 DIAGNOSIS — R0681 Apnea, not elsewhere classified: Secondary | ICD-10-CM

## 2023-09-02 DIAGNOSIS — R5383 Other fatigue: Secondary | ICD-10-CM

## 2023-09-02 DIAGNOSIS — R0683 Snoring: Secondary | ICD-10-CM

## 2023-09-04 ENCOUNTER — Ambulatory Visit (HOSPITAL_BASED_OUTPATIENT_CLINIC_OR_DEPARTMENT_OTHER): Payer: Medicare HMO | Attending: Family Medicine | Admitting: Internal Medicine

## 2023-09-04 DIAGNOSIS — G4736 Sleep related hypoventilation in conditions classified elsewhere: Secondary | ICD-10-CM | POA: Diagnosis not present

## 2023-09-04 DIAGNOSIS — R0683 Snoring: Secondary | ICD-10-CM

## 2023-09-04 DIAGNOSIS — R0681 Apnea, not elsewhere classified: Secondary | ICD-10-CM

## 2023-09-04 DIAGNOSIS — G4733 Obstructive sleep apnea (adult) (pediatric): Secondary | ICD-10-CM | POA: Insufficient documentation

## 2023-09-04 DIAGNOSIS — R5383 Other fatigue: Secondary | ICD-10-CM

## 2023-09-12 DIAGNOSIS — R5383 Other fatigue: Secondary | ICD-10-CM

## 2023-09-12 DIAGNOSIS — G4733 Obstructive sleep apnea (adult) (pediatric): Secondary | ICD-10-CM

## 2023-09-12 DIAGNOSIS — R0681 Apnea, not elsewhere classified: Secondary | ICD-10-CM | POA: Diagnosis not present

## 2023-09-12 DIAGNOSIS — G4736 Sleep related hypoventilation in conditions classified elsewhere: Secondary | ICD-10-CM

## 2023-09-12 DIAGNOSIS — R0683 Snoring: Secondary | ICD-10-CM

## 2023-09-12 NOTE — Procedures (Signed)
      Patient Name: Alexa Wright, Lye Date: 09/07/2023 Gender: Female D.O.B: 1966/09/13 Age (years): 57 Referring Provider: Richardean Chimera Height (inches): 66 Interpreting Physician: Jetty Duhamel MD, ABSM Weight (lbs): 247 RPSGT: [place] Sink BMI: 40 MRN: [id number] Neck Size: 15.00  CLINICAL INFORMATION Sleep Study Type: HST Indication for sleep study: Snoring Epworth Sleepiness Score: 11  SLEEP STUDY TECHNIQUE A multi-channel overnight portable sleep study was performed. The channels recorded were: nasal airflow, thoracic respiratory movement, and oxygen saturation with a pulse oximetry. Snoring was also monitored.  MEDICATIONS Patient self administered medications include: none reported.  SLEEP ARCHITECTURE Patient was studied for 424.9 minutes. The sleep efficiency was 100.0 % and the patient was supine for 0%. The arousal index was 0.0 per hour.  RESPIRATORY PARAMETERS The overall AHI was 63.5 per hour, with a central apnea index of 0.1 per hour. The oxygen nadir was 77% during sleep.  CARDIAC DATA Mean heart rate during sleep was 76.7 bpm.  IMPRESSIONS - Severe obstructive sleep apnea occurred during this study (AHI = 63.5/h). - No significant central sleep apnea occurred during this study (CAI = 0.1/h). - Oxygen desaturation was noted during this study (Min O2 = 77%, Mean 94%). Time with O2 saturation 88% or less was 17 minutes. - Patient snored .  DIAGNOSIS - Obstructive Sleep Apnea (G47.33) - Nocturnal Hypoxemia (G47.36)  RECOMMENDATIONS - Suggest CPAP titration sleep study or autopap. Other options would be based on clinical judgment. - Avoid alcohol, sedatives and other CNS depressants that may worsen sleep apnea and disrupt normal sleep architecture. - Sleep hygiene should be reviewed to assess factors that may improve sleep quality. - Weight management and regular exercise should be initiated or continued.  [Electronically signed]  09/12/2023 11:42 AM  Jetty Duhamel MD, ABSM Diplomate, American Board of Sleep Medicine NPI: [id number]             Jetty Duhamel Diplomate, American Board of Sleep Medicine  ELECTRONICALLY SIGNED ON:  09/12/2023, 11:39 AM [facility] PH: [phone]   FX: [phone] ACCREDITED BY THE AMERICAN ACADEMY OF SLEEP MEDICINE

## 2023-09-14 ENCOUNTER — Telehealth: Payer: Self-pay | Admitting: Internal Medicine

## 2023-09-14 NOTE — Telephone Encounter (Signed)
Alexa Wright states needs sleep study information. [name] phone number is [phone]. Fax number is [phone].

## 2023-09-15 NOTE — Telephone Encounter (Signed)
ATC [name] from [facility], no answer.  No VM. Faxed sleep study results to [facility], [phone], received fax confirmation that fax was sent successfully.  Nothing further needed.

## 2023-09-24 ENCOUNTER — Ambulatory Visit: Payer: Medicare HMO | Admitting: Internal Medicine

## 2023-09-24 ENCOUNTER — Encounter: Payer: Self-pay | Admitting: Internal Medicine

## 2023-09-24 VITALS — BP 135/90 | Temp 98.6°F | Ht 65.0 in | Wt 250.2 lb

## 2023-09-24 DIAGNOSIS — K760 Fatty (change of) liver, not elsewhere classified: Secondary | ICD-10-CM | POA: Diagnosis not present

## 2023-09-24 DIAGNOSIS — Z6839 Body mass index (BMI) 39.0-39.9, adult: Secondary | ICD-10-CM

## 2023-09-24 DIAGNOSIS — K219 Gastro-esophageal reflux disease without esophagitis: Secondary | ICD-10-CM | POA: Diagnosis not present

## 2023-09-24 DIAGNOSIS — K200 Eosinophilic esophagitis: Secondary | ICD-10-CM

## 2023-09-24 DIAGNOSIS — R7989 Other specified abnormal findings of blood chemistry: Secondary | ICD-10-CM

## 2023-09-24 NOTE — Patient Instructions (Signed)
We will schedule you for upper endoscopy to reevaluate your eosinophilic esophagitis, reflux, difficulty with food getting stuck.  I may elect to stretch your esophagus depending on findings.  Continue on pantoprazole twice daily.  We may make changes pending endoscopic evaluation.  It was very nice seeing you again today.  Dr. Marletta Lor

## 2023-09-24 NOTE — H&P (View-Only) (Signed)
 Referring Provider: Richardean Chimera, MD Primary Care Physician:  Richardean Chimera, MD Primary GI:  Dr. Marletta Lor  Chief Complaint  Patient presents with   New Patient (Initial Visit)    Pt here to reschedule EGD     HPI:   Alexa Wright is a 57 y.o. female who presents to clinic today for follow-up visit.  Has not been seen since May 2024.  She has a history of GERD, dysphagia, eosinophilic esophagitis, prior esophageal dilation in 2022, elevated LFTs.  GERD, dysphagia, EOE: EGD 08/06/2021 esophageal findings consistent with eosinophilic esophagitis. Esophageal biopsies showed squamous mucosa with focally increased intraepithelial eosinophils greater than 20 per high-power field. I started her on pantoprazole 40 mg twice daily. Also performed esophageal dilation.   At 1 point was switched to esomeprazole twice daily eventually reverted back to pantoprazole 40 mg twice daily.  Followed up with [place] in May 24 with plans for repeat upper endoscopy and subsequent dilation the patient canceled as she was admitted to the hospital.  Has been trying to adhere to 6 food illumination diet though it has been difficult especially dairy.  Continues to have issues with breakthrough reflux symptoms primarily at night.  Certain foods such as spaghetti/red sauce irritate her symptoms.  Does note dysphagia as well intermittent.  MASLD, Abnormal LFTs: Labs completed 01/01/2022 with normalization of LFTs, autoimmune labs, celiac screen, acute hepatitis panel, iron levels within normal limits.  She was immune to hepatitis B.   Ultrasound with elastography 01/09/2022 with hepatic steatosis, median K PA 3.7.  Blood work 06/28/2023 with AST 15, ALT 19, alk phos 125, T. bili 0.6.    Past Medical History:  Diagnosis Date   Asthma    Bradycardia    Degenerative disc disease    Depression    Eosinophilic esophagitis    Fibromyalgia    Gastroesophageal reflux disease    Hypertension    Migraine    Obesity     PAC (premature atrial contraction)     Past Surgical History:  Procedure Laterality Date   ACHILLES TENDON REPAIR Left    BALLOON DILATION N/A 08/06/2021   Procedure: BALLOON DILATION;  Surgeon: Lanelle Bal, DO;  [facility]: [facility];  Service: Endoscopy;  Laterality: N/A;   BIOPSY  08/06/2021   Procedure: BIOPSY;  Surgeon: Lanelle Bal, DO;  Location: [facility];  Service: Endoscopy;;   ESOPHAGOGASTRODUODENOSCOPY (EGD) WITH PROPOFOL N/A 08/06/2021   Procedure: ESOPHAGOGASTRODUODENOSCOPY (EGD) WITH PROPOFOL;  Surgeon: Lanelle Bal, DO;  Location: [facility];  Service: Endoscopy;  Laterality: N/A;  8:15am   GALLBLADDER SURGERY      Current Outpatient Medications  Medication Sig Dispense Refill   albuterol (VENTOLIN HFA) 108 (90 Base) MCG/ACT inhaler Inhale 2 puffs into the lungs every 6 (six) hours as needed for wheezing or shortness of breath.     ALPRAZolam (XANAX) 0.5 MG tablet Take 0.5 mg by mouth 3 (three) times daily as needed for sleep. For sleep or anxiety     fluticasone-salmeterol (ADVAIR HFA) 45-21 MCG/ACT inhaler Inhale into the lungs.     loratadine (CLARITIN) 10 MG tablet Take 10 mg by mouth daily as needed.     pantoprazole (PROTONIX) 40 MG tablet Take 1 tablet (40 mg total) by mouth 2 (two) times daily. 60 tablet 3   rosuvastatin (CRESTOR) 5 MG tablet Take 5 mg by mouth daily.     valsartan-hydrochlorothiazide (DIOVAN-HCT) 160-12.5 MG tablet Take 1 tablet by mouth  daily.     Vitamin D, Ergocalciferol, (DRISDOL) 1.25 MG (50000 UNIT) CAPS capsule Take 50,000 Units by mouth once a week.     doxycycline (VIBRA-TABS) 100 MG tablet Take 100 mg by mouth 2 (two) times daily. (Patient not taking: Reported on 09/24/2023)     No current facility-administered medications for this visit.    Allergies as of 09/24/2023 - Review Complete 09/24/2023  Allergen Reaction Noted   Bee pollen Swelling 10/31/2014   Dilaudid [hydromorphone hcl] Itching 05/15/2012    Fentanyl Other (See Comments) 07/31/2021   Hydrocodone Other (See Comments) 05/15/2012   Lisinopril Cough 03/08/2015   Morphine and codeine  03/04/2013   Paroxetine hcl Other (See Comments) 08/26/2011   Duloxetine Nausea Only 08/26/2011   Etodolac Nausea And Vomiting 08/26/2011   Latex Rash 03/04/2013   Milnacipran  02/19/2011    Family History  Problem Relation Age of Onset   Esophageal cancer Mother        Living   Heart attack Father        Living   Mental retardation Sister    Migraines Sister    Migraines Sister    Migraines Daughter    Colon cancer Neg Hx    Gastric cancer Neg Hx     Social History   Socioeconomic History   Marital status: Married    Spouse name: Not on file   Number of children: Not on file   Years of education: Not on file   Highest education level: Not on file  Occupational History   Not on file  Tobacco Use   Smoking status: Former    Current packs/day: 0.00    Types: Cigarettes    Quit date: 01/31/1992    Years since quitting: 31.6   Smokeless tobacco: Never  Vaping Use   Vaping status: Never Used  Substance and Sexual Activity   Alcohol use: No    Alcohol/week: 0.0 standard drinks of alcohol   Drug use: No   Sexual activity: Yes  Other Topics Concern   Not on file  Social History Narrative   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  She is on disability for ?bipolar disorder and fibromyalgia in January 2012.   She lives at home with her husband.   Highest level of education:  Associated degree.    Social Determinants of Health   Financial Resource Strain: Not on file  Food Insecurity: Not on file  Transportation Needs: Not on file  Physical Activity: Inactive (09/06/2021)   Received from [facility], [facility]   Exercise Vital Sign    Days of Exercise per Week: 0 days    Minutes of Exercise per Session: 0 min  Stress: No Stress Concern Present (09/06/2021)   Received from [facility],  [facility] - Occupational Stress Questionnaire    Feeling of Stress : Not at all  Social Connections: Not on file    Subjective: Review of Systems  Constitutional:  Negative for chills and fever.  HENT:  Negative for congestion and hearing loss.   Eyes:  Negative for blurred vision and double vision.  Respiratory:  Negative for cough and shortness of breath.   Cardiovascular:  Negative for chest pain and palpitations.  Gastrointestinal:  Positive for heartburn. Negative for abdominal pain, blood in stool, constipation, diarrhea, melena and vomiting.       Dysphagia  Genitourinary:  Negative  for dysuria and urgency.  Musculoskeletal:  Negative for joint pain and myalgias.  Skin:  Negative for itching and rash.  Neurological:  Negative for dizziness and headaches.  Psychiatric/Behavioral:  Negative for depression. The patient is not nervous/anxious.      Objective: BP (!) 155/100   Temp 98.6 F (37 C)   Ht 5\' 5"  (1.651 m)   Wt 250 lb 3.2 oz (113.5 kg)   LMP 03/10/2019 (Approximate)   BMI 41.64 kg/m  Physical Exam Constitutional:      Appearance: Normal appearance.  HENT:     Head: Normocephalic and atraumatic.  Eyes:     Extraocular Movements: Extraocular movements intact.     Conjunctiva/sclera: Conjunctivae normal.  Cardiovascular:     Rate and Rhythm: Normal rate and regular rhythm.  Pulmonary:     Effort: Pulmonary effort is normal.     Breath sounds: Normal breath sounds.  Abdominal:     General: Bowel sounds are normal.     Palpations: Abdomen is soft.  Musculoskeletal:        General: No swelling. Normal range of motion.     Cervical back: Normal range of motion and neck supple.  Skin:    General: Skin is warm and dry.     Coloration: Skin is not jaundiced.  Neurological:     General: No focal deficit present.     Mental Status: She is alert and oriented to person, place, and time.  Psychiatric:        Mood  and Affect: Mood normal.        Behavior: Behavior normal.      Assessment: *Eosinophilic esophagitis *Chronic GERD-having breakthrough symptoms *Esophageal dysphagia *MASLD *Abnormal LFTs  Plan: Will schedule for EGD with possible dilation. The risks including infection, bleed, or perforation as well as benefits, limitations, alternatives and imponderables have been reviewed with the patient. Potential for esophageal dilation, biopsy, etc. have also been reviewed.  Questions have been answered. All parties agreeable.  Continue on pantoprazole twice daily.  May need to add on swallowed fluticasone pending endoscopic findings.  Continue 6 food elimination diet.  LFTs are normalized, patient with evidence of fatty liver on ultrasound, likely due to MASLD.  Recommend 1-2# weight loss per week until ideal body weight through exercise & diet. Low fat/cholesterol diet.   Avoid sweets, sodas, fruit juices, sweetened beverages like tea, etc. Gradually increase exercise from 15 min daily up to 1 hr per day 5 days/week. Limit alcohol use.  Follow-up after upper endoscopy. 09/24/2023 1:45 PM   Disclaimer: This note was dictated with voice recognition software. Similar sounding words can inadvertently be transcribed and may not be corrected upon review.

## 2023-09-24 NOTE — Progress Notes (Signed)
Referring Provider: Richardean Chimera, MD Primary Care Physician:  Richardean Chimera, MD Primary GI:  Dr. Marletta Lor  Chief Complaint  Patient presents with   New Patient (Initial Visit)    Pt here to reschedule EGD     HPI:   Alexa Wright is a 57 y.o. female who presents to clinic today for follow-up visit.  Has not been seen since May 2024.  She has a history of GERD, dysphagia, eosinophilic esophagitis, prior esophageal dilation in 2022, elevated LFTs.  GERD, dysphagia, EOE: EGD 08/06/2021 esophageal findings consistent with eosinophilic esophagitis. Esophageal biopsies showed squamous mucosa with focally increased intraepithelial eosinophils greater than 20 per high-power field. I started her on pantoprazole 40 mg twice daily. Also performed esophageal dilation.   At 1 point was switched to esomeprazole twice daily eventually reverted back to pantoprazole 40 mg twice daily.  Followed up with [place] in May 24 with plans for repeat upper endoscopy and subsequent dilation the patient canceled as she was admitted to the hospital.  Has been trying to adhere to 6 food illumination diet though it has been difficult especially dairy.  Continues to have issues with breakthrough reflux symptoms primarily at night.  Certain foods such as spaghetti/red sauce irritate her symptoms.  Does note dysphagia as well intermittent.  MASLD, Abnormal LFTs: Labs completed 01/01/2022 with normalization of LFTs, autoimmune labs, celiac screen, acute hepatitis panel, iron levels within normal limits.  She was immune to hepatitis B.   Ultrasound with elastography 01/09/2022 with hepatic steatosis, median K PA 3.7.  Blood work 06/28/2023 with AST 15, ALT 19, alk phos 125, T. bili 0.6.    Past Medical History:  Diagnosis Date   Asthma    Bradycardia    Degenerative disc disease    Depression    Eosinophilic esophagitis    Fibromyalgia    Gastroesophageal reflux disease    Hypertension    Migraine    Obesity     PAC (premature atrial contraction)     Past Surgical History:  Procedure Laterality Date   ACHILLES TENDON REPAIR Left    BALLOON DILATION N/A 08/06/2021   Procedure: BALLOON DILATION;  Surgeon: Lanelle Bal, DO;  Location: [facility];  Service: Endoscopy;  Laterality: N/A;   BIOPSY  08/06/2021   Procedure: BIOPSY;  Surgeon: Lanelle Bal, DO;  Location: [facility];  Service: Endoscopy;;   ESOPHAGOGASTRODUODENOSCOPY (EGD) WITH PROPOFOL N/A 08/06/2021   Procedure: ESOPHAGOGASTRODUODENOSCOPY (EGD) WITH PROPOFOL;  Surgeon: Lanelle Bal, DO;  Location: [facility];  Service: Endoscopy;  Laterality: N/A;  8:15am   GALLBLADDER SURGERY      Current Outpatient Medications  Medication Sig Dispense Refill   albuterol (VENTOLIN HFA) 108 (90 Base) MCG/ACT inhaler Inhale 2 puffs into the lungs every 6 (six) hours as needed for wheezing or shortness of breath.     ALPRAZolam (XANAX) 0.5 MG tablet Take 0.5 mg by mouth 3 (three) times daily as needed for sleep. For sleep or anxiety     fluticasone-salmeterol (ADVAIR HFA) 45-21 MCG/ACT inhaler Inhale into the lungs.     loratadine (CLARITIN) 10 MG tablet Take 10 mg by mouth daily as needed.     pantoprazole (PROTONIX) 40 MG tablet Take 1 tablet (40 mg total) by mouth 2 (two) times daily. 60 tablet 3   rosuvastatin (CRESTOR) 5 MG tablet Take 5 mg by mouth daily.     valsartan-hydrochlorothiazide (DIOVAN-HCT) 160-12.5 MG tablet Take 1 tablet by mouth  daily.     Vitamin D, Ergocalciferol, (DRISDOL) 1.25 MG (50000 UNIT) CAPS capsule Take 50,000 Units by mouth once a week.     doxycycline (VIBRA-TABS) 100 MG tablet Take 100 mg by mouth 2 (two) times daily. (Patient not taking: Reported on 09/24/2023)     No current facility-administered medications for this visit.    Allergies as of 09/24/2023 - Review Complete 09/24/2023  Allergen Reaction Noted   Bee pollen Swelling 10/31/2014   Dilaudid [hydromorphone hcl] Itching 05/15/2012    Fentanyl Other (See Comments) 07/31/2021   Hydrocodone Other (See Comments) 05/15/2012   Lisinopril Cough 03/08/2015   Morphine and codeine  03/04/2013   Paroxetine hcl Other (See Comments) 08/26/2011   Duloxetine Nausea Only 08/26/2011   Etodolac Nausea And Vomiting 08/26/2011   Latex Rash 03/04/2013   Milnacipran  02/19/2011    Family History  Problem Relation Age of Onset   Esophageal cancer Mother        Living   Heart attack Father        Living   Mental retardation Sister    Migraines Sister    Migraines Sister    Migraines Daughter    Colon cancer Neg Hx    Gastric cancer Neg Hx     Social History   Socioeconomic History   Marital status: Married    Spouse name: Not on file   Number of children: Not on file   Years of education: Not on file   Highest education level: Not on file  Occupational History   Not on file  Tobacco Use   Smoking status: Former    Current packs/day: 0.00    Types: Cigarettes    Quit date: 01/31/1992    Years since quitting: 31.6   Smokeless tobacco: Never  Vaping Use   Vaping status: Never Used  Substance and Sexual Activity   Alcohol use: No    Alcohol/week: 0.0 standard drinks of alcohol   Drug use: No   Sexual activity: Yes  Other Topics Concern   Not on file  Social History Narrative   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  She is on disability for ?bipolar disorder and fibromyalgia in January 2012.   She lives at home with her husband.   Highest level of education:  Associated degree.    Social Determinants of Health   Financial Resource Strain: Not on file  Food Insecurity: Not on file  Transportation Needs: Not on file  Physical Activity: Inactive (09/06/2021)   Received from [facility], [facility]   Exercise Vital Sign    Days of Exercise per Week: 0 days    Minutes of Exercise per Session: 0 min  Stress: No Stress Concern Present (09/06/2021)   Received from [facility],  [facility] - Occupational Stress Questionnaire    Feeling of Stress : Not at all  Social Connections: Not on file    Subjective: Review of Systems  Constitutional:  Negative for chills and fever.  HENT:  Negative for congestion and hearing loss.   Eyes:  Negative for blurred vision and double vision.  Respiratory:  Negative for cough and shortness of breath.   Cardiovascular:  Negative for chest pain and palpitations.  Gastrointestinal:  Positive for heartburn. Negative for abdominal pain, blood in stool, constipation, diarrhea, melena and vomiting.       Dysphagia  Genitourinary:  Negative  for dysuria and urgency.  Musculoskeletal:  Negative for joint pain and myalgias.  Skin:  Negative for itching and rash.  Neurological:  Negative for dizziness and headaches.  Psychiatric/Behavioral:  Negative for depression. The patient is not nervous/anxious.      Objective: BP (!) 155/100   Temp 98.6 F (37 C)   Ht 5\' 5"  (1.651 m)   Wt 250 lb 3.2 oz (113.5 kg)   LMP 03/10/2019 (Approximate)   BMI 41.64 kg/m  Physical Exam Constitutional:      Appearance: Normal appearance.  HENT:     Head: Normocephalic and atraumatic.  Eyes:     Extraocular Movements: Extraocular movements intact.     Conjunctiva/sclera: Conjunctivae normal.  Cardiovascular:     Rate and Rhythm: Normal rate and regular rhythm.  Pulmonary:     Effort: Pulmonary effort is normal.     Breath sounds: Normal breath sounds.  Abdominal:     General: Bowel sounds are normal.     Palpations: Abdomen is soft.  Musculoskeletal:        General: No swelling. Normal range of motion.     Cervical back: Normal range of motion and neck supple.  Skin:    General: Skin is warm and dry.     Coloration: Skin is not jaundiced.  Neurological:     General: No focal deficit present.     Mental Status: She is alert and oriented to person, place, and time.  Psychiatric:        Mood  and Affect: Mood normal.        Behavior: Behavior normal.      Assessment: *Eosinophilic esophagitis *Chronic GERD-having breakthrough symptoms *Esophageal dysphagia *MASLD *Abnormal LFTs  Plan: Will schedule for EGD with possible dilation. The risks including infection, bleed, or perforation as well as benefits, limitations, alternatives and imponderables have been reviewed with the patient. Potential for esophageal dilation, biopsy, etc. have also been reviewed.  Questions have been answered. All parties agreeable.  Continue on pantoprazole twice daily.  May need to add on swallowed fluticasone pending endoscopic findings.  Continue 6 food elimination diet.  LFTs are normalized, patient with evidence of fatty liver on ultrasound, likely due to MASLD.  Recommend 1-2# weight loss per week until ideal body weight through exercise & diet. Low fat/cholesterol diet.   Avoid sweets, sodas, fruit juices, sweetened beverages like tea, etc. Gradually increase exercise from 15 min daily up to 1 hr per day 5 days/week. Limit alcohol use.  Follow-up after upper endoscopy. 09/24/2023 1:45 PM   Disclaimer: This note was dictated with voice recognition software. Similar sounding words can inadvertently be transcribed and may not be corrected upon review.

## 2023-09-29 ENCOUNTER — Telehealth: Payer: Self-pay | Admitting: *Deleted

## 2023-09-29 DIAGNOSIS — K219 Gastro-esophageal reflux disease without esophagitis: Secondary | ICD-10-CM

## 2023-09-29 DIAGNOSIS — K200 Eosinophilic esophagitis: Secondary | ICD-10-CM

## 2023-09-29 LAB — COMPREHENSIVE METABOLIC PANEL: EGFR: 59.3

## 2023-09-29 NOTE — Telephone Encounter (Signed)
Spoke with pt. Scheduled for EGD/ED with propofol asa 2 12/3. Aware will send instructions via mychart as she stated she has access to this.patient asked if I can fax her order to [facility] for her labwork.

## 2023-10-06 ENCOUNTER — Encounter (HOSPITAL_COMMUNITY)
Admission: RE | Disposition: A | Discharge status: Home / Self Care | Payer: Self-pay | Source: Home / Self Care | Attending: Internal Medicine

## 2023-10-06 ENCOUNTER — Other Ambulatory Visit: Payer: Self-pay

## 2023-10-06 ENCOUNTER — Ambulatory Visit (HOSPITAL_COMMUNITY): Payer: Medicare HMO | Admitting: Anesthesiology

## 2023-10-06 ENCOUNTER — Encounter (HOSPITAL_COMMUNITY): Payer: Self-pay

## 2023-10-06 ENCOUNTER — Ambulatory Visit (HOSPITAL_COMMUNITY)
Admission: RE | Admit: 2023-10-06 | Discharge: 2023-10-06 | Disposition: A | Discharge status: Home / Self Care | Payer: Medicare HMO | Attending: Internal Medicine | Admitting: Internal Medicine

## 2023-10-06 DIAGNOSIS — R12 Heartburn: Secondary | ICD-10-CM | POA: Diagnosis not present

## 2023-10-06 DIAGNOSIS — K200 Eosinophilic esophagitis: Secondary | ICD-10-CM | POA: Insufficient documentation

## 2023-10-06 DIAGNOSIS — R1310 Dysphagia, unspecified: Secondary | ICD-10-CM | POA: Insufficient documentation

## 2023-10-06 DIAGNOSIS — Z6841 Body Mass Index (BMI) 40.0 and over, adult: Secondary | ICD-10-CM | POA: Insufficient documentation

## 2023-10-06 DIAGNOSIS — I10 Essential (primary) hypertension: Secondary | ICD-10-CM | POA: Insufficient documentation

## 2023-10-06 DIAGNOSIS — Z79899 Other long term (current) drug therapy: Secondary | ICD-10-CM | POA: Insufficient documentation

## 2023-10-06 DIAGNOSIS — E66813 Obesity, class 3: Secondary | ICD-10-CM | POA: Insufficient documentation

## 2023-10-06 DIAGNOSIS — Z87891 Personal history of nicotine dependence: Secondary | ICD-10-CM | POA: Diagnosis not present

## 2023-10-06 DIAGNOSIS — K2970 Gastritis, unspecified, without bleeding: Secondary | ICD-10-CM | POA: Insufficient documentation

## 2023-10-06 DIAGNOSIS — K219 Gastro-esophageal reflux disease without esophagitis: Secondary | ICD-10-CM

## 2023-10-06 HISTORY — PX: BIOPSY: SHX5522

## 2023-10-06 HISTORY — PX: ESOPHAGOGASTRODUODENOSCOPY (EGD) WITH PROPOFOL: SHX5813

## 2023-10-06 HISTORY — DX: Sleep apnea, unspecified: G47.30

## 2023-10-06 SURGERY — ESOPHAGOGASTRODUODENOSCOPY (EGD) WITH PROPOFOL
Anesthesia: General

## 2023-10-06 MED ORDER — LIDOCAINE HCL (CARDIAC) PF 100 MG/5ML IV SOSY
PREFILLED_SYRINGE | INTRAVENOUS | Status: DC | PRN
Start: 2023-10-06 — End: 2023-10-06
  Administered 2023-10-06: 100 mg via INTRAVENOUS

## 2023-10-06 MED ORDER — LIDOCAINE HCL (PF) 2 % IJ SOLN
INTRAMUSCULAR | Status: AC
  Filled 2023-10-06: qty 5

## 2023-10-06 MED ORDER — GLYCOPYRROLATE PF 0.2 MG/ML IJ SOSY
PREFILLED_SYRINGE | INTRAMUSCULAR | Status: AC
  Filled 2023-10-06: qty 1

## 2023-10-06 MED ORDER — PROPOFOL 500 MG/50ML IV EMUL
INTRAVENOUS | Status: DC | PRN
  Administered 2023-10-06: 125 ug/kg/min via INTRAVENOUS

## 2023-10-06 MED ORDER — LACTATED RINGERS IV SOLN: INTRAVENOUS | Status: DC | PRN

## 2023-10-06 MED ORDER — PROPOFOL 10 MG/ML IV BOLUS
INTRAVENOUS | Status: DC | PRN
  Administered 2023-10-06: 50 mg via INTRAVENOUS
  Administered 2023-10-06: 80 mg via INTRAVENOUS

## 2023-10-06 MED ORDER — PROPOFOL 1000 MG/100ML IV EMUL
INTRAVENOUS | Status: AC
Start: 2023-10-06 — End: ?
  Filled 2023-10-06: qty 100

## 2023-10-06 NOTE — Discharge Instructions (Signed)
EGD Discharge instructions Please read the instructions outlined below and refer to this sheet in the next few weeks. These discharge instructions provide you with general information on caring for yourself after you leave the hospital. Your doctor may also give you specific instructions. While your treatment has been planned according to the most current medical practices available, unavoidable complications occasionally occur. If you have any problems or questions after discharge, please call your doctor. ACTIVITY You may resume your regular activity but move at a slower pace for the next 24 hours.  Take frequent rest periods for the next 24 hours.  Walking will help expel (get rid of) the air and reduce the bloated feeling in your abdomen.  No driving for 24 hours (because of the anesthesia (medicine) used during the test).  You may shower.  Do not sign any important legal documents or operate any machinery for 24 hours (because of the anesthesia used during the test).  NUTRITION Drink plenty of fluids.  You may resume your normal diet.  Begin with a light meal and progress to your normal diet.  Avoid alcoholic beverages for 24 hours or as instructed by your caregiver.  MEDICATIONS You may resume your normal medications unless your caregiver tells you otherwise.  WHAT YOU CAN EXPECT TODAY You may experience abdominal discomfort such as a feeling of fullness or "gas" pains.  FOLLOW-UP Your doctor will discuss the results of your test with you.  SEEK IMMEDIATE MEDICAL ATTENTION IF ANY OF THE FOLLOWING OCCUR: Excessive nausea (feeling sick to your stomach) and/or vomiting.  Severe abdominal pain and distention (swelling).  Trouble swallowing.  Temperature over 101 F (37.8 C).  Rectal bleeding or vomiting of blood.   Overall, your esophagus looks much improved compared to prior endoscopy.  I did take samples of it today.    Do have mild gastritis of your stomach.  I took samples of  your stomach as well.  Small bowel appeared normal.  We will call with these results.  Continue on pantoprazole twice daily.  Follow-up in GI office in 3 months.  I hope you have a great rest of your week!  Hennie Duos. Marletta Lor, D.O. Gastroenterology and Hepatology [facility]

## 2023-10-06 NOTE — Transfer of Care (Addendum)
Immediate Anesthesia Transfer of Care Note  Patient: SHANAIYA PALAZZI  Procedure(s) Performed: ESOPHAGOGASTRODUODENOSCOPY (EGD) WITH PROPOFOL BIOPSY  Patient Location: Endoscopy Unit  Anesthesia Type:General  Level of Consciousness: drowsy and patient cooperative  Airway & Oxygen Therapy: Patient Spontanous Breathing and Patient connected to nasal cannula oxygen  Post-op Assessment: Report given to RN and Post -op Vital signs reviewed and stable  Post vital signs: Reviewed and stable  Last Vitals:  Vitals Value Taken Time  BP 98/63 10/06/23   0807  Temp 36.7 10/06/23   0807  Pulse 99 10/06/23 0807  Resp 20 10/06/23   0807  SpO2 95% 10/06/23   0807    Last Pain:  Vitals:   10/06/23 0807  TempSrc: Oral  PainSc:       Patients Stated Pain Goal: 7 (10/06/23 0717)  Complications: No notable events documented.

## 2023-10-06 NOTE — Interval H&P Note (Signed)
History and Physical Interval Note:  10/06/2023 7:28 AM  Alexa Wright  has presented today for surgery, with the diagnosis of GERD, EOSINOPHILLIC ESOPHAGITIS.  The various methods of treatment have been discussed with the patient and family. After consideration of risks, benefits and other options for treatment, the patient has consented to  Procedure(s) with comments: ESOPHAGOGASTRODUODENOSCOPY (EGD) WITH PROPOFOL (N/A) - 830am, asa 2 BALLOON DILATION (N/A) as a surgical intervention.  The patient's history has been reviewed, patient examined, no change in status, stable for surgery.  I have reviewed the patient's chart and labs.  Questions were answered to the patient's satisfaction.     Lanelle Bal

## 2023-10-06 NOTE — Anesthesia Postprocedure Evaluation (Signed)
Anesthesia Post Note  Patient: Alexa Wright  Procedure(s) Performed: ESOPHAGOGASTRODUODENOSCOPY (EGD) WITH PROPOFOL BIOPSY  Patient location during evaluation: PACU Anesthesia Type: General Level of consciousness: awake and alert Pain management: pain level controlled Vital Signs Assessment: post-procedure vital signs reviewed and stable Respiratory status: spontaneous breathing, nonlabored ventilation, respiratory function stable and patient connected to nasal cannula oxygen Cardiovascular status: blood pressure returned to baseline and stable Postop Assessment: no apparent nausea or vomiting Anesthetic complications: no   There were no known notable events for this encounter.   Last Vitals:  Vitals:   10/06/23 0727 10/06/23 0807  BP: 116/82 98/63  Pulse: (!) 106 99  Resp: 16 20  Temp: 36.6 C 36.7 C  SpO2: 97% 95%    Last Pain:  Vitals:   10/06/23 0807  TempSrc: Oral  PainSc: 0-No pain                 Nickalaus Crooke L Tou Hayner

## 2023-10-06 NOTE — Anesthesia Preprocedure Evaluation (Addendum)
Anesthesia Evaluation  Patient identified by MRN, date of birth, ID band Patient awake    Reviewed: Allergy & Precautions, H&P , NPO status , Patient's Chart, lab work & pertinent test results, reviewed documented beta blocker date and time   Airway Mallampati: III  TM Distance: >3 FB Neck ROM: full    Dental no notable dental hx. (+) Dental Advisory Given, Teeth Intact   Pulmonary asthma , former smoker   Pulmonary exam normal breath sounds clear to auscultation       Cardiovascular Exercise Tolerance: Good hypertension, Normal cardiovascular exam Rhythm:regular Rate:Normal     Neuro/Psych  Headaches PSYCHIATRIC DISORDERS  Depression     Neuromuscular disease    GI/Hepatic Neg liver ROS,GERD  Medicated,,  Endo/Other    Class 3 obesity  Renal/GU negative Renal ROS  negative genitourinary   Musculoskeletal   Abdominal   Peds  Hematology negative hematology ROS (+)   Anesthesia Other Findings   Reproductive/Obstetrics negative OB ROS                             Anesthesia Physical Anesthesia Plan  ASA: 3  Anesthesia Plan: General   Post-op Pain Management: Minimal or no pain anticipated   Induction: Intravenous  PONV Risk Score and Plan: Propofol infusion  Airway Management Planned: Nasal Cannula and Natural Airway  Additional Equipment: None  Intra-op Plan:   Post-operative Plan:   Informed Consent: I have reviewed the patients History and Physical, chart, labs and discussed the procedure including the risks, benefits and alternatives for the proposed anesthesia with the patient or authorized representative who has indicated his/her understanding and acceptance.     Dental Advisory Given  Plan Discussed with: CRNA  Anesthesia Plan Comments:         Anesthesia Quick Evaluation

## 2023-10-06 NOTE — Op Note (Signed)
[facility] Patient Name: Alexa Wright Procedure Date: 10/06/2023 7:41 AM MRN: [id number] Date of Birth: 02-Mar-1966 Attending MD: Hennie Duos. Marletta Lor , [place], [id number] CSN: [id number] Age: 57 Admit Type: Outpatient Procedure:                Upper GI endoscopy Indications:              Dysphagia, Heartburn, Follow-up of eosinophilic                            esophagitis Providers:                Hennie Duos. Marletta Lor, DO, Buel Ream. Thomasena Edis RN, RN,                            Zena Amos Referring MD:              Medicines:                See the Anesthesia note for documentation of the                            administered medications Complications:            No immediate complications. Estimated Blood Loss:     Estimated blood loss was minimal. Procedure:                Pre-Anesthesia Assessment:                           - The anesthesia plan was to use monitored                            anesthesia care (MAC).                           After obtaining informed consent, the endoscope was                            passed under direct vision. Throughout the                            procedure, the patient's blood pressure, pulse, and                            oxygen saturations were monitored continuously. The                            [id number]-H190 ([id number]) scope was introduced through the                            mouth, and advanced to the second part of duodenum.                            The upper GI endoscopy was accomplished without                            difficulty. The patient tolerated the procedure  well. Scope In: 7:56:36 AM Scope Out: 8:03:28 AM Total Procedure Duration: 0 hours 6 minutes 52 seconds  Findings:      The Z-line was regular and was found 36 cm from the incisors. Previously       noted EOE findings vastly improved.      Biopsies were taken with a cold forceps in the middle third of the       esophagus for  histology.      Patchy mild inflammation characterized by erosions and erythema was       found in the gastric body. Biopsies were taken with a cold forceps for       Helicobacter pylori testing.      The duodenal bulb, first portion of the duodenum and second portion of       the duodenum were normal. Impression:               - Z-line regular, 36 cm from the incisors.                           - Gastritis. Biopsied.                           - Normal duodenal bulb, first portion of the                            duodenum and second portion of the duodenum.                           - Biopsies were taken with a cold forceps for                            histology in the middle third of the esophagus. Moderate Sedation:      Per Anesthesia Care Recommendation:           - Patient has a contact number available for                            emergencies. The signs and symptoms of potential                            delayed complications were discussed with the                            patient. Return to normal activities tomorrow.                            Written discharge instructions were provided to the                            patient.                           - Resume previous diet.                           - Continue present medications.                           -  Await pathology results.                           - Use Protonix (pantoprazole) 40 mg PO BID.                           - Return to GI clinic in 3 months. Procedure Code(s):        --- Professional ---                           [phone], Esophagogastroduodenoscopy, flexible,                            transoral; with biopsy, single or multiple Diagnosis Code(s):        --- Professional ---                           K29.70, Gastritis, unspecified, without bleeding                           R13.10, Dysphagia, unspecified                           R12, Heartburn                           K20.0, Eosinophilic  esophagitis CPT copyright 2022 American Medical Association. All rights reserved. The codes documented in this report are preliminary and upon coder review may  be revised to meet current compliance requirements. Hennie Duos. Marletta Lor, DO Hennie Duos. Marletta Lor, DO 10/06/2023 8:06:56 AM This report has been signed electronically. Number of Addenda: 0

## 2023-10-07 LAB — SURGICAL PATHOLOGY

## 2023-10-14 ENCOUNTER — Encounter (HOSPITAL_COMMUNITY): Payer: Self-pay | Admitting: Internal Medicine

## 2023-10-19 ENCOUNTER — Other Ambulatory Visit: Payer: Self-pay

## 2023-10-19 ENCOUNTER — Encounter (HOSPITAL_COMMUNITY): Payer: Self-pay

## 2023-10-19 ENCOUNTER — Emergency Department (HOSPITAL_COMMUNITY): Payer: Medicare HMO

## 2023-10-19 ENCOUNTER — Emergency Department (HOSPITAL_COMMUNITY)
Admission: EM | Admit: 2023-10-19 | Discharge: 2023-10-20 | Discharge status: Against Medical Advice | Payer: Medicare HMO | Attending: Emergency Medicine | Admitting: Emergency Medicine

## 2023-10-19 DIAGNOSIS — R531 Weakness: Secondary | ICD-10-CM | POA: Insufficient documentation

## 2023-10-19 DIAGNOSIS — H538 Other visual disturbances: Secondary | ICD-10-CM | POA: Insufficient documentation

## 2023-10-19 DIAGNOSIS — Z5321 Procedure and treatment not carried out due to patient leaving prior to being seen by health care provider: Secondary | ICD-10-CM | POA: Insufficient documentation

## 2023-10-19 DIAGNOSIS — R110 Nausea: Secondary | ICD-10-CM | POA: Diagnosis not present

## 2023-10-19 LAB — CBC WITH DIFFERENTIAL/PLATELET
Abs Immature Granulocytes: 0.04 10*3/uL (ref 0.00–0.07)
Basophils Absolute: 0 10*3/uL (ref 0.0–0.1)
Basophils Relative: 0 %
Eosinophils Absolute: 0.1 10*3/uL (ref 0.0–0.5)
Eosinophils Relative: 0 %
HCT: 45.7 % (ref 36.0–46.0)
Hemoglobin: 14.7 g/dL (ref 12.0–15.0)
Immature Granulocytes: 0 %
Lymphocytes Relative: 5 %
Lymphs Abs: 0.6 10*3/uL — ABNORMAL LOW (ref 0.7–4.0)
MCH: 27.5 pg (ref 26.0–34.0)
MCHC: 32.2 g/dL (ref 30.0–36.0)
MCV: 85.4 fL (ref 80.0–100.0)
Monocytes Absolute: 0.5 10*3/uL (ref 0.1–1.0)
Monocytes Relative: 5 %
Neutro Abs: 10.1 10*3/uL — ABNORMAL HIGH (ref 1.7–7.7)
Neutrophils Relative %: 90 %
Platelets: 286 10*3/uL (ref 150–400)
RBC: 5.35 MIL/uL — ABNORMAL HIGH (ref 3.87–5.11)
RDW: 14.3 % (ref 11.5–15.5)
WBC: 11.4 10*3/uL — ABNORMAL HIGH (ref 4.0–10.5)
nRBC: 0 % (ref 0.0–0.2)

## 2023-10-19 LAB — TROPONIN I (HIGH SENSITIVITY)
Troponin I (High Sensitivity): 4 ng/L (ref <18)
Troponin I (High Sensitivity): <2 ng/L (ref <18)

## 2023-10-19 LAB — BASIC METABOLIC PANEL
Anion gap: 12 (ref 5–15)
BUN: 10 mg/dL (ref 6–20)
CO2: 23 mmol/L (ref 22–32)
Calcium: 9.6 mg/dL (ref 8.9–10.3)
Chloride: 102 mmol/L (ref 98–111)
Creatinine, Ser: 0.92 mg/dL (ref 0.44–1.00)
GFR, Estimated: >60 mL/min (ref >60)
Glucose, Bld: 113 mg/dL — ABNORMAL HIGH (ref 70–99)
Potassium: 3.9 mmol/L (ref 3.5–5.1)
Sodium: 137 mmol/L (ref 135–145)

## 2023-10-19 LAB — CBG MONITORING, ED: Glucose-Capillary: 115 mg/dL — ABNORMAL HIGH (ref 70–99)

## 2023-10-19 MED ORDER — ONDANSETRON 4 MG PO TBDP
4.0000 mg | ORAL_TABLET | Freq: Once | ORAL | Status: AC
  Administered 2023-10-19: 4 mg via ORAL
  Filled 2023-10-19: qty 1

## 2023-10-19 NOTE — ED Triage Notes (Signed)
Pt c.o near syncopal episode about 2 hours ago while helping her husband get discharged upstairs from his hospital admission. Pt states she got really hot, sweaty and felt really nauseated. At this time she feels generally weak.

## 2023-10-19 NOTE — ED Provider Triage Note (Signed)
Emergency Medicine Provider Triage Evaluation Note  Alexa Wright , a 57 y.o. female  was evaluated in triage.  Pt complains of near syncope. Occurred around 1230 pm today. Was upstairs with her husband who admitted to the hospital. All of a sudden she was nauseas and vision was gray and she fell like she was going to pass out but did not and symptoms have improved. Denies chest pain, Shob, and palpitations.  Review of Systems  Positive: See above Negative: See above  Physical Exam  BP (!) 146/98   Pulse (!) 111   Temp 98.1 F (36.7 C)   Resp 16   LMP 03/10/2019 (Approximate)   SpO2 100%  Gen:   Awake, no distress   Resp:  Normal effort  MSK:   Moves extremities without difficulty  Other:    Medical Decision Making  Medically screening exam initiated at 3:12 PM.  Appropriate orders placed.  SUMEYA SISE was informed that the remainder of the evaluation will be completed by another provider, this initial triage assessment does not replace that evaluation, and the importance of remaining in the ED until their evaluation is complete.  Work up started   Gareth Eagle, PA-C 10/19/23 1513

## 2023-10-20 MED ORDER — ACETAMINOPHEN 500 MG PO TABS: 1000.0000 mg | ORAL_TABLET | Freq: Once | ORAL | Status: DC

## 2023-10-20 NOTE — ED Notes (Signed)
Pt called several times to get taken to the back, no response

## 2023-12-15 ENCOUNTER — Encounter: Payer: Self-pay | Admitting: Gastroenterology

## 2024-06-07 NOTE — Procedures (Unsigned)
Result scanned to media

## 2024-09-15 ENCOUNTER — Other Ambulatory Visit (HOSPITAL_BASED_OUTPATIENT_CLINIC_OR_DEPARTMENT_OTHER): Payer: Self-pay

## 2024-09-15 MED ORDER — ONDANSETRON 4 MG PO TBDP
4.0000 mg | ORAL_TABLET | Freq: Three times a day (TID) | ORAL | 0 refills | Status: AC | PRN
  Filled 2024-09-15: qty 10, 4d supply, fill #0

## 2024-10-20 ENCOUNTER — Other Ambulatory Visit (HOSPITAL_BASED_OUTPATIENT_CLINIC_OR_DEPARTMENT_OTHER): Payer: Self-pay

## 2024-10-20 MED ORDER — VALSARTAN 80 MG PO TABS
80.0000 mg | ORAL_TABLET | Freq: Every day | ORAL | 3 refills | Status: AC
  Filled 2024-10-20: qty 90, 90d supply, fill #0
